# Patient Record
Sex: Female | Born: 1935
Health system: Southern US, Community
[De-identification: ages and names within clinical notes are randomized; demographics above are authoritative.]

## PROBLEM LIST (undated history)

## (undated) DIAGNOSIS — M1990 Unspecified osteoarthritis, unspecified site: Secondary | ICD-10-CM

## (undated) DIAGNOSIS — E785 Hyperlipidemia, unspecified: Secondary | ICD-10-CM

## (undated) DIAGNOSIS — E559 Vitamin D deficiency, unspecified: Secondary | ICD-10-CM

## (undated) DIAGNOSIS — K219 Gastro-esophageal reflux disease without esophagitis: Secondary | ICD-10-CM

## (undated) DIAGNOSIS — M8580 Other specified disorders of bone density and structure, unspecified site: Secondary | ICD-10-CM

## (undated) DIAGNOSIS — G43909 Migraine, unspecified, not intractable, without status migrainosus: Secondary | ICD-10-CM

## (undated) DIAGNOSIS — M810 Age-related osteoporosis without current pathological fracture: Secondary | ICD-10-CM

## (undated) HISTORY — DX: Migraine, unspecified, not intractable, without status migrainosus: G43.909

## (undated) HISTORY — DX: Age-related osteoporosis without current pathological fracture: M81.0

## (undated) HISTORY — DX: Hyperlipidemia, unspecified: E78.5

## (undated) HISTORY — PX: HERNIA REPAIR: SHX51

## (undated) HISTORY — DX: Gastro-esophageal reflux disease without esophagitis: K21.9

## (undated) HISTORY — DX: Other specified disorders of bone density and structure, unspecified site: M85.80

## (undated) HISTORY — DX: Vitamin D deficiency, unspecified: E55.9

---

## 2009-05-16 ENCOUNTER — Emergency Department (HOSPITAL_COMMUNITY)
Admission: EM | Admit: 2009-05-16 | Discharge: 2009-05-16 | Payer: Self-pay | Source: Home / Self Care | Admitting: Family Medicine

## 2009-06-01 DIAGNOSIS — K219 Gastro-esophageal reflux disease without esophagitis: Secondary | ICD-10-CM | POA: Insufficient documentation

## 2009-06-28 ENCOUNTER — Encounter: Admission: RE | Admit: 2009-06-28 | Discharge: 2009-06-28 | Payer: Self-pay | Admitting: Family Medicine

## 2009-07-03 ENCOUNTER — Encounter: Admission: RE | Admit: 2009-07-03 | Discharge: 2009-07-03 | Payer: Self-pay | Admitting: Family Medicine

## 2009-07-15 ENCOUNTER — Encounter: Admission: RE | Admit: 2009-07-15 | Discharge: 2009-07-15 | Payer: Self-pay | Admitting: Specialist

## 2009-10-05 ENCOUNTER — Encounter: Admission: RE | Admit: 2009-10-05 | Discharge: 2009-10-05 | Payer: Self-pay | Admitting: Neurosurgery

## 2009-11-21 ENCOUNTER — Encounter: Admission: RE | Admit: 2009-11-21 | Discharge: 2010-01-22 | Payer: Self-pay | Admitting: Rheumatology

## 2013-02-17 ENCOUNTER — Other Ambulatory Visit: Payer: Self-pay

## 2013-02-17 DIAGNOSIS — Z1231 Encounter for screening mammogram for malignant neoplasm of breast: Secondary | ICD-10-CM

## 2013-03-22 ENCOUNTER — Ambulatory Visit: Payer: Self-pay

## 2013-12-15 ENCOUNTER — Emergency Department (HOSPITAL_BASED_OUTPATIENT_CLINIC_OR_DEPARTMENT_OTHER)
Admission: EM | Admit: 2013-12-15 | Discharge: 2013-12-15 | Discharge status: Against Medical Advice | Payer: Medicare Other | Attending: Emergency Medicine | Admitting: Emergency Medicine

## 2013-12-15 ENCOUNTER — Encounter (HOSPITAL_BASED_OUTPATIENT_CLINIC_OR_DEPARTMENT_OTHER): Payer: Self-pay | Admitting: Emergency Medicine

## 2013-12-15 DIAGNOSIS — R109 Unspecified abdominal pain: Secondary | ICD-10-CM | POA: Insufficient documentation

## 2013-12-15 HISTORY — DX: Unspecified osteoarthritis, unspecified site: M19.90

## 2013-12-15 LAB — URINALYSIS, ROUTINE W REFLEX MICROSCOPIC
BILIRUBIN URINE: NEGATIVE
Glucose, UA: NEGATIVE mg/dL
Hgb urine dipstick: NEGATIVE
KETONES UR: NEGATIVE mg/dL
LEUKOCYTES UA: NEGATIVE
Nitrite: NEGATIVE
PROTEIN: NEGATIVE mg/dL
Specific Gravity, Urine: 1.017 (ref 1.005–1.030)
UROBILINOGEN UA: 0.2 mg/dL (ref 0.0–1.0)
pH: 6 (ref 5.0–8.0)

## 2013-12-15 NOTE — ED Notes (Signed)
Right flank pain x 3 days.

## 2013-12-15 NOTE — ED Notes (Signed)
Pt's family sts that they are taking the pt to [facility].

## 2015-04-30 DIAGNOSIS — J300 Vasomotor rhinitis: Secondary | ICD-10-CM | POA: Diagnosis not present

## 2015-04-30 DIAGNOSIS — Z713 Dietary counseling and surveillance: Secondary | ICD-10-CM | POA: Diagnosis not present

## 2015-04-30 DIAGNOSIS — E663 Overweight: Secondary | ICD-10-CM | POA: Diagnosis not present

## 2015-04-30 DIAGNOSIS — Z6828 Body mass index (BMI) 28.0-28.9, adult: Secondary | ICD-10-CM | POA: Diagnosis not present

## 2015-04-30 DIAGNOSIS — H6123 Impacted cerumen, bilateral: Secondary | ICD-10-CM | POA: Diagnosis not present

## 2015-04-30 DIAGNOSIS — H9313 Tinnitus, bilateral: Secondary | ICD-10-CM | POA: Diagnosis not present

## 2015-04-30 DIAGNOSIS — H6093 Unspecified otitis externa, bilateral: Secondary | ICD-10-CM | POA: Diagnosis not present

## 2015-07-11 DIAGNOSIS — H02054 Trichiasis without entropian left upper eyelid: Secondary | ICD-10-CM | POA: Diagnosis not present

## 2015-07-11 DIAGNOSIS — H02051 Trichiasis without entropian right upper eyelid: Secondary | ICD-10-CM | POA: Diagnosis not present

## 2016-02-25 DIAGNOSIS — H02051 Trichiasis without entropian right upper eyelid: Secondary | ICD-10-CM | POA: Diagnosis not present

## 2016-02-25 DIAGNOSIS — H1013 Acute atopic conjunctivitis, bilateral: Secondary | ICD-10-CM | POA: Diagnosis not present

## 2016-02-25 DIAGNOSIS — H02054 Trichiasis without entropian left upper eyelid: Secondary | ICD-10-CM | POA: Diagnosis not present

## 2016-03-12 DIAGNOSIS — M79641 Pain in right hand: Secondary | ICD-10-CM | POA: Diagnosis not present

## 2016-03-12 DIAGNOSIS — Z79899 Other long term (current) drug therapy: Secondary | ICD-10-CM | POA: Diagnosis not present

## 2016-03-12 DIAGNOSIS — M150 Primary generalized (osteo)arthritis: Secondary | ICD-10-CM | POA: Diagnosis not present

## 2016-03-12 DIAGNOSIS — M0589 Other rheumatoid arthritis with rheumatoid factor of multiple sites: Secondary | ICD-10-CM | POA: Diagnosis not present

## 2016-03-12 DIAGNOSIS — M79642 Pain in left hand: Secondary | ICD-10-CM | POA: Diagnosis not present

## 2016-03-12 DIAGNOSIS — M2550 Pain in unspecified joint: Secondary | ICD-10-CM | POA: Diagnosis not present

## 2016-03-18 DIAGNOSIS — H16143 Punctate keratitis, bilateral: Secondary | ICD-10-CM | POA: Diagnosis not present

## 2016-03-18 DIAGNOSIS — Z961 Presence of intraocular lens: Secondary | ICD-10-CM | POA: Diagnosis not present

## 2016-03-18 DIAGNOSIS — H02051 Trichiasis without entropian right upper eyelid: Secondary | ICD-10-CM | POA: Diagnosis not present

## 2016-03-18 DIAGNOSIS — Z9841 Cataract extraction status, right eye: Secondary | ICD-10-CM | POA: Diagnosis not present

## 2016-03-18 DIAGNOSIS — H02413 Mechanical ptosis of bilateral eyelids: Secondary | ICD-10-CM | POA: Diagnosis not present

## 2016-03-18 DIAGNOSIS — L908 Other atrophic disorders of skin: Secondary | ICD-10-CM | POA: Diagnosis not present

## 2016-03-18 DIAGNOSIS — H02011 Cicatricial entropion of right upper eyelid: Secondary | ICD-10-CM | POA: Diagnosis not present

## 2016-03-18 DIAGNOSIS — H11233 Symblepharon, bilateral: Secondary | ICD-10-CM | POA: Diagnosis not present

## 2016-03-18 DIAGNOSIS — Z9842 Cataract extraction status, left eye: Secondary | ICD-10-CM | POA: Diagnosis not present

## 2016-03-18 DIAGNOSIS — H02054 Trichiasis without entropian left upper eyelid: Secondary | ICD-10-CM | POA: Diagnosis not present

## 2016-03-18 DIAGNOSIS — L121 Cicatricial pemphigoid: Secondary | ICD-10-CM | POA: Diagnosis not present

## 2016-03-18 DIAGNOSIS — H02014 Cicatricial entropion of left upper eyelid: Secondary | ICD-10-CM | POA: Diagnosis not present

## 2016-03-19 DIAGNOSIS — R05 Cough: Secondary | ICD-10-CM | POA: Diagnosis not present

## 2016-03-19 DIAGNOSIS — M069 Rheumatoid arthritis, unspecified: Secondary | ICD-10-CM | POA: Diagnosis not present

## 2016-03-19 DIAGNOSIS — J329 Chronic sinusitis, unspecified: Secondary | ICD-10-CM | POA: Diagnosis not present

## 2016-03-19 DIAGNOSIS — H6980 Other specified disorders of Eustachian tube, unspecified ear: Secondary | ICD-10-CM | POA: Diagnosis not present

## 2016-03-26 DIAGNOSIS — Z23 Encounter for immunization: Secondary | ICD-10-CM | POA: Diagnosis not present

## 2016-03-27 DIAGNOSIS — Z961 Presence of intraocular lens: Secondary | ICD-10-CM | POA: Diagnosis not present

## 2016-03-27 DIAGNOSIS — H02051 Trichiasis without entropian right upper eyelid: Secondary | ICD-10-CM | POA: Diagnosis not present

## 2016-03-27 DIAGNOSIS — H02053 Trichiasis without entropian right eye, unspecified eyelid: Secondary | ICD-10-CM | POA: Diagnosis not present

## 2016-03-27 DIAGNOSIS — H02011 Cicatricial entropion of right upper eyelid: Secondary | ICD-10-CM | POA: Diagnosis not present

## 2016-03-27 DIAGNOSIS — H02054 Trichiasis without entropian left upper eyelid: Secondary | ICD-10-CM | POA: Diagnosis not present

## 2016-03-27 DIAGNOSIS — L121 Cicatricial pemphigoid: Secondary | ICD-10-CM | POA: Diagnosis not present

## 2016-03-27 DIAGNOSIS — H02014 Cicatricial entropion of left upper eyelid: Secondary | ICD-10-CM | POA: Diagnosis not present

## 2016-04-28 DIAGNOSIS — Z79899 Other long term (current) drug therapy: Secondary | ICD-10-CM | POA: Diagnosis not present

## 2016-04-28 DIAGNOSIS — L121 Cicatricial pemphigoid: Secondary | ICD-10-CM | POA: Diagnosis not present

## 2016-04-28 DIAGNOSIS — Z5181 Encounter for therapeutic drug level monitoring: Secondary | ICD-10-CM | POA: Diagnosis not present

## 2016-05-16 DIAGNOSIS — J329 Chronic sinusitis, unspecified: Secondary | ICD-10-CM | POA: Diagnosis not present

## 2016-05-16 DIAGNOSIS — M1990 Unspecified osteoarthritis, unspecified site: Secondary | ICD-10-CM | POA: Diagnosis not present

## 2016-05-16 DIAGNOSIS — K5909 Other constipation: Secondary | ICD-10-CM | POA: Diagnosis not present

## 2016-05-26 DIAGNOSIS — Z9841 Cataract extraction status, right eye: Secondary | ICD-10-CM | POA: Diagnosis not present

## 2016-05-26 DIAGNOSIS — H02051 Trichiasis without entropian right upper eyelid: Secondary | ICD-10-CM | POA: Diagnosis not present

## 2016-05-26 DIAGNOSIS — L121 Cicatricial pemphigoid: Secondary | ICD-10-CM | POA: Diagnosis not present

## 2016-05-26 DIAGNOSIS — H11243 Scarring of conjunctiva, bilateral: Secondary | ICD-10-CM | POA: Diagnosis not present

## 2016-05-26 DIAGNOSIS — H2189 Other specified disorders of iris and ciliary body: Secondary | ICD-10-CM | POA: Diagnosis not present

## 2016-05-26 DIAGNOSIS — H02403 Unspecified ptosis of bilateral eyelids: Secondary | ICD-10-CM | POA: Diagnosis not present

## 2016-05-26 DIAGNOSIS — H0289 Other specified disorders of eyelid: Secondary | ICD-10-CM | POA: Diagnosis not present

## 2016-05-26 DIAGNOSIS — H02011 Cicatricial entropion of right upper eyelid: Secondary | ICD-10-CM | POA: Diagnosis not present

## 2016-05-26 DIAGNOSIS — Z9889 Other specified postprocedural states: Secondary | ICD-10-CM | POA: Diagnosis not present

## 2016-05-26 DIAGNOSIS — H02056 Trichiasis without entropian left eye, unspecified eyelid: Secondary | ICD-10-CM | POA: Diagnosis not present

## 2016-05-26 DIAGNOSIS — H16143 Punctate keratitis, bilateral: Secondary | ICD-10-CM | POA: Diagnosis not present

## 2016-05-26 DIAGNOSIS — Z9842 Cataract extraction status, left eye: Secondary | ICD-10-CM | POA: Diagnosis not present

## 2016-05-26 DIAGNOSIS — H02014 Cicatricial entropion of left upper eyelid: Secondary | ICD-10-CM | POA: Diagnosis not present

## 2016-05-26 DIAGNOSIS — Z961 Presence of intraocular lens: Secondary | ICD-10-CM | POA: Diagnosis not present

## 2016-05-26 DIAGNOSIS — H02053 Trichiasis without entropian right eye, unspecified eyelid: Secondary | ICD-10-CM | POA: Diagnosis not present

## 2016-05-26 DIAGNOSIS — H02054 Trichiasis without entropian left upper eyelid: Secondary | ICD-10-CM | POA: Diagnosis not present

## 2016-06-02 DIAGNOSIS — Z79899 Other long term (current) drug therapy: Secondary | ICD-10-CM | POA: Diagnosis not present

## 2016-06-02 DIAGNOSIS — L121 Cicatricial pemphigoid: Secondary | ICD-10-CM | POA: Diagnosis not present

## 2016-06-05 DIAGNOSIS — M0589 Other rheumatoid arthritis with rheumatoid factor of multiple sites: Secondary | ICD-10-CM | POA: Diagnosis not present

## 2016-06-05 DIAGNOSIS — M1712 Unilateral primary osteoarthritis, left knee: Secondary | ICD-10-CM | POA: Diagnosis not present

## 2016-06-05 DIAGNOSIS — M25511 Pain in right shoulder: Secondary | ICD-10-CM | POA: Diagnosis not present

## 2016-06-05 DIAGNOSIS — Z79899 Other long term (current) drug therapy: Secondary | ICD-10-CM | POA: Diagnosis not present

## 2016-06-05 DIAGNOSIS — M150 Primary generalized (osteo)arthritis: Secondary | ICD-10-CM | POA: Diagnosis not present

## 2016-06-05 DIAGNOSIS — M25569 Pain in unspecified knee: Secondary | ICD-10-CM | POA: Diagnosis not present

## 2016-06-05 DIAGNOSIS — M810 Age-related osteoporosis without current pathological fracture: Secondary | ICD-10-CM | POA: Diagnosis not present

## 2016-06-05 DIAGNOSIS — M1711 Unilateral primary osteoarthritis, right knee: Secondary | ICD-10-CM | POA: Diagnosis not present

## 2016-06-13 DIAGNOSIS — M150 Primary generalized (osteo)arthritis: Secondary | ICD-10-CM | POA: Diagnosis not present

## 2016-06-13 DIAGNOSIS — M0589 Other rheumatoid arthritis with rheumatoid factor of multiple sites: Secondary | ICD-10-CM | POA: Diagnosis not present

## 2016-06-13 DIAGNOSIS — Z79899 Other long term (current) drug therapy: Secondary | ICD-10-CM | POA: Diagnosis not present

## 2016-06-13 DIAGNOSIS — M810 Age-related osteoporosis without current pathological fracture: Secondary | ICD-10-CM | POA: Diagnosis not present

## 2016-06-17 DIAGNOSIS — H04553 Acquired stenosis of bilateral nasolacrimal duct: Secondary | ICD-10-CM | POA: Diagnosis not present

## 2016-06-17 DIAGNOSIS — Z9889 Other specified postprocedural states: Secondary | ICD-10-CM | POA: Diagnosis not present

## 2016-06-17 DIAGNOSIS — Z48810 Encounter for surgical aftercare following surgery on the sense organs: Secondary | ICD-10-CM | POA: Diagnosis not present

## 2016-06-17 DIAGNOSIS — Z79899 Other long term (current) drug therapy: Secondary | ICD-10-CM | POA: Diagnosis not present

## 2016-06-17 DIAGNOSIS — L121 Cicatricial pemphigoid: Secondary | ICD-10-CM | POA: Diagnosis not present

## 2016-07-10 DIAGNOSIS — M810 Age-related osteoporosis without current pathological fracture: Secondary | ICD-10-CM | POA: Diagnosis not present

## 2016-07-10 DIAGNOSIS — M150 Primary generalized (osteo)arthritis: Secondary | ICD-10-CM | POA: Diagnosis not present

## 2016-07-10 DIAGNOSIS — M0589 Other rheumatoid arthritis with rheumatoid factor of multiple sites: Secondary | ICD-10-CM | POA: Diagnosis not present

## 2016-07-10 DIAGNOSIS — M25511 Pain in right shoulder: Secondary | ICD-10-CM | POA: Diagnosis not present

## 2016-07-10 DIAGNOSIS — Z79899 Other long term (current) drug therapy: Secondary | ICD-10-CM | POA: Diagnosis not present

## 2016-07-11 ENCOUNTER — Other Ambulatory Visit: Payer: Self-pay | Admitting: Rheumatology

## 2016-07-11 DIAGNOSIS — M25511 Pain in right shoulder: Secondary | ICD-10-CM

## 2016-07-17 DIAGNOSIS — M79675 Pain in left toe(s): Secondary | ICD-10-CM | POA: Diagnosis not present

## 2016-07-22 DIAGNOSIS — D2372 Other benign neoplasm of skin of left lower limb, including hip: Secondary | ICD-10-CM | POA: Diagnosis not present

## 2016-07-22 DIAGNOSIS — M25572 Pain in left ankle and joints of left foot: Secondary | ICD-10-CM | POA: Diagnosis not present

## 2016-07-27 ENCOUNTER — Ambulatory Visit
Admission: RE | Admit: 2016-07-27 | Discharge: 2016-07-27 | Disposition: A | Discharge status: Home / Self Care | Payer: Medicare Other | Source: Ambulatory Visit | Attending: Rheumatology | Admitting: Rheumatology

## 2016-07-27 DIAGNOSIS — M25511 Pain in right shoulder: Secondary | ICD-10-CM | POA: Diagnosis not present

## 2016-11-04 DIAGNOSIS — H6980 Other specified disorders of Eustachian tube, unspecified ear: Secondary | ICD-10-CM | POA: Diagnosis not present

## 2016-11-12 DIAGNOSIS — H9311 Tinnitus, right ear: Secondary | ICD-10-CM | POA: Diagnosis not present

## 2016-11-24 DIAGNOSIS — L121 Cicatricial pemphigoid: Secondary | ICD-10-CM | POA: Diagnosis not present

## 2016-11-24 DIAGNOSIS — H02054 Trichiasis without entropian left upper eyelid: Secondary | ICD-10-CM | POA: Diagnosis not present

## 2016-11-24 DIAGNOSIS — H02051 Trichiasis without entropian right upper eyelid: Secondary | ICD-10-CM | POA: Diagnosis not present

## 2016-11-28 DIAGNOSIS — M67911 Unspecified disorder of synovium and tendon, right shoulder: Secondary | ICD-10-CM | POA: Diagnosis not present

## 2016-12-02 DIAGNOSIS — K5900 Constipation, unspecified: Secondary | ICD-10-CM | POA: Diagnosis not present

## 2016-12-04 DIAGNOSIS — H903 Sensorineural hearing loss, bilateral: Secondary | ICD-10-CM | POA: Diagnosis not present

## 2016-12-04 DIAGNOSIS — H9313 Tinnitus, bilateral: Secondary | ICD-10-CM | POA: Diagnosis not present

## 2016-12-04 DIAGNOSIS — H6983 Other specified disorders of Eustachian tube, bilateral: Secondary | ICD-10-CM | POA: Diagnosis not present

## 2016-12-05 DIAGNOSIS — M19211 Secondary osteoarthritis, right shoulder: Secondary | ICD-10-CM | POA: Diagnosis not present

## 2016-12-11 ENCOUNTER — Other Ambulatory Visit: Payer: Self-pay | Admitting: Orthopedic Surgery

## 2016-12-16 ENCOUNTER — Other Ambulatory Visit: Payer: Self-pay | Admitting: Obstetrics & Gynecology

## 2016-12-16 ENCOUNTER — Other Ambulatory Visit: Payer: Self-pay | Admitting: Orthopedic Surgery

## 2016-12-23 DIAGNOSIS — H02051 Trichiasis without entropian right upper eyelid: Secondary | ICD-10-CM | POA: Diagnosis not present

## 2016-12-23 DIAGNOSIS — H02054 Trichiasis without entropian left upper eyelid: Secondary | ICD-10-CM | POA: Diagnosis not present

## 2016-12-23 DIAGNOSIS — Z7189 Other specified counseling: Secondary | ICD-10-CM | POA: Diagnosis not present

## 2016-12-23 DIAGNOSIS — Z9842 Cataract extraction status, left eye: Secondary | ICD-10-CM | POA: Diagnosis not present

## 2016-12-23 DIAGNOSIS — H04129 Dry eye syndrome of unspecified lacrimal gland: Secondary | ICD-10-CM | POA: Diagnosis not present

## 2016-12-23 DIAGNOSIS — H179 Unspecified corneal scar and opacity: Secondary | ICD-10-CM | POA: Diagnosis not present

## 2016-12-23 DIAGNOSIS — Z961 Presence of intraocular lens: Secondary | ICD-10-CM | POA: Diagnosis not present

## 2016-12-23 DIAGNOSIS — H02056 Trichiasis without entropian left eye, unspecified eyelid: Secondary | ICD-10-CM | POA: Diagnosis not present

## 2016-12-23 DIAGNOSIS — H02413 Mechanical ptosis of bilateral eyelids: Secondary | ICD-10-CM | POA: Diagnosis not present

## 2016-12-23 DIAGNOSIS — Z9889 Other specified postprocedural states: Secondary | ICD-10-CM | POA: Diagnosis not present

## 2016-12-23 DIAGNOSIS — H11233 Symblepharon, bilateral: Secondary | ICD-10-CM | POA: Diagnosis not present

## 2016-12-23 DIAGNOSIS — H0289 Other specified disorders of eyelid: Secondary | ICD-10-CM | POA: Diagnosis not present

## 2016-12-23 DIAGNOSIS — E785 Hyperlipidemia, unspecified: Secondary | ICD-10-CM | POA: Diagnosis not present

## 2016-12-23 DIAGNOSIS — H02014 Cicatricial entropion of left upper eyelid: Secondary | ICD-10-CM | POA: Diagnosis not present

## 2016-12-23 DIAGNOSIS — Z9841 Cataract extraction status, right eye: Secondary | ICD-10-CM | POA: Diagnosis not present

## 2016-12-23 DIAGNOSIS — H02011 Cicatricial entropion of right upper eyelid: Secondary | ICD-10-CM | POA: Diagnosis not present

## 2016-12-23 DIAGNOSIS — H16143 Punctate keratitis, bilateral: Secondary | ICD-10-CM | POA: Diagnosis not present

## 2016-12-23 DIAGNOSIS — H04553 Acquired stenosis of bilateral nasolacrimal duct: Secondary | ICD-10-CM | POA: Diagnosis not present

## 2016-12-23 DIAGNOSIS — H02053 Trichiasis without entropian right eye, unspecified eyelid: Secondary | ICD-10-CM | POA: Diagnosis not present

## 2016-12-23 DIAGNOSIS — L121 Cicatricial pemphigoid: Secondary | ICD-10-CM | POA: Diagnosis not present

## 2016-12-23 DIAGNOSIS — H04203 Unspecified epiphora, bilateral lacrimal glands: Secondary | ICD-10-CM | POA: Diagnosis not present

## 2016-12-23 DIAGNOSIS — H02523 Blepharophimosis right eye, unspecified eyelid: Secondary | ICD-10-CM | POA: Diagnosis not present

## 2016-12-23 DIAGNOSIS — H02526 Blepharophimosis left eye, unspecified eyelid: Secondary | ICD-10-CM | POA: Diagnosis not present

## 2016-12-30 ENCOUNTER — Encounter (HOSPITAL_COMMUNITY): Payer: Self-pay

## 2016-12-30 ENCOUNTER — Ambulatory Visit (HOSPITAL_COMMUNITY)
Admission: RE | Admit: 2016-12-30 | Discharge: 2016-12-30 | Disposition: A | Discharge status: Home / Self Care | Payer: Medicare Other | Source: Ambulatory Visit | Attending: Orthopedic Surgery | Admitting: Orthopedic Surgery

## 2016-12-30 ENCOUNTER — Encounter (HOSPITAL_COMMUNITY)
Admission: RE | Admit: 2016-12-30 | Discharge: 2016-12-30 | Disposition: A | Discharge status: Home / Self Care | Payer: Medicare Other | Source: Ambulatory Visit | Attending: Orthopedic Surgery | Admitting: Orthopedic Surgery

## 2016-12-30 DIAGNOSIS — Z01818 Encounter for other preprocedural examination: Secondary | ICD-10-CM | POA: Insufficient documentation

## 2016-12-30 DIAGNOSIS — Z96611 Presence of right artificial shoulder joint: Secondary | ICD-10-CM | POA: Diagnosis not present

## 2016-12-30 DIAGNOSIS — R918 Other nonspecific abnormal finding of lung field: Secondary | ICD-10-CM | POA: Insufficient documentation

## 2016-12-30 DIAGNOSIS — I700 Atherosclerosis of aorta: Secondary | ICD-10-CM | POA: Insufficient documentation

## 2016-12-30 DIAGNOSIS — M810 Age-related osteoporosis without current pathological fracture: Secondary | ICD-10-CM | POA: Diagnosis not present

## 2016-12-30 DIAGNOSIS — M47814 Spondylosis without myelopathy or radiculopathy, thoracic region: Secondary | ICD-10-CM

## 2016-12-30 DIAGNOSIS — M19011 Primary osteoarthritis, right shoulder: Secondary | ICD-10-CM | POA: Diagnosis not present

## 2016-12-30 DIAGNOSIS — S42141A Displaced fracture of glenoid cavity of scapula, right shoulder, initial encounter for closed fracture: Secondary | ICD-10-CM | POA: Diagnosis not present

## 2016-12-30 DIAGNOSIS — M9689 Other intraoperative and postprocedural complications and disorders of the musculoskeletal system: Secondary | ICD-10-CM | POA: Diagnosis not present

## 2016-12-30 DIAGNOSIS — Z79899 Other long term (current) drug therapy: Secondary | ICD-10-CM | POA: Diagnosis not present

## 2016-12-30 DIAGNOSIS — Z5321 Procedure and treatment not carried out due to patient leaving prior to being seen by health care provider: Secondary | ICD-10-CM | POA: Diagnosis not present

## 2016-12-30 DIAGNOSIS — Z91018 Allergy to other foods: Secondary | ICD-10-CM | POA: Diagnosis not present

## 2016-12-30 DIAGNOSIS — M75101 Unspecified rotator cuff tear or rupture of right shoulder, not specified as traumatic: Secondary | ICD-10-CM | POA: Diagnosis not present

## 2016-12-30 DIAGNOSIS — M9669 Fracture of other bone following insertion of orthopedic implant, joint prosthesis, or bone plate: Secondary | ICD-10-CM | POA: Diagnosis not present

## 2016-12-30 LAB — COMPREHENSIVE METABOLIC PANEL WITH GFR
ALT: 12 U/L — ABNORMAL LOW (ref 14–54)
AST: 17 U/L (ref 15–41)
Albumin: 4.2 g/dL (ref 3.5–5.0)
Alkaline Phosphatase: 53 U/L (ref 38–126)
Anion gap: 5 (ref 5–15)
BUN: 9 mg/dL (ref 6–20)
CO2: 27 mmol/L (ref 22–32)
Calcium: 9.5 mg/dL (ref 8.9–10.3)
Chloride: 105 mmol/L (ref 101–111)
Creatinine, Ser: 0.53 mg/dL (ref 0.44–1.00)
GFR calc Af Amer: >60 mL/min
GFR calc non Af Amer: >60 mL/min
Glucose, Bld: 94 mg/dL (ref 65–99)
Potassium: 4.5 mmol/L (ref 3.5–5.1)
Sodium: 137 mmol/L (ref 135–145)
Total Bilirubin: 0.7 mg/dL (ref 0.3–1.2)
Total Protein: 7.1 g/dL (ref 6.5–8.1)

## 2016-12-30 LAB — SURGICAL PCR SCREEN
MRSA, PCR: NEGATIVE
Staphylococcus aureus: POSITIVE — AB

## 2016-12-30 LAB — URINALYSIS, ROUTINE W REFLEX MICROSCOPIC
BILIRUBIN URINE: NEGATIVE
GLUCOSE, UA: NEGATIVE mg/dL
HGB URINE DIPSTICK: NEGATIVE
Ketones, ur: NEGATIVE mg/dL
Leukocytes, UA: NEGATIVE
Nitrite: NEGATIVE
PH: 7 (ref 5.0–8.0)
Protein, ur: NEGATIVE mg/dL
SPECIFIC GRAVITY, URINE: 1.006 (ref 1.005–1.030)

## 2016-12-30 LAB — CBC WITH DIFFERENTIAL/PLATELET
BASOS PCT: 0 %
Basophils Absolute: 0 10*3/uL (ref 0.0–0.1)
Eosinophils Absolute: 0.1 10*3/uL (ref 0.0–0.7)
Eosinophils Relative: 2 %
HEMATOCRIT: 39.6 % (ref 36.0–46.0)
HEMOGLOBIN: 12.9 g/dL (ref 12.0–15.0)
LYMPHS ABS: 2.5 10*3/uL (ref 0.7–4.0)
Lymphocytes Relative: 39 %
MCH: 29.5 pg (ref 26.0–34.0)
MCHC: 32.6 g/dL (ref 30.0–36.0)
MCV: 90.6 fL (ref 78.0–100.0)
MONOS PCT: 8 %
Monocytes Absolute: 0.5 10*3/uL (ref 0.1–1.0)
NEUTROS ABS: 3.2 10*3/uL (ref 1.7–7.7)
NEUTROS PCT: 51 %
Platelets: 270 10*3/uL (ref 150–400)
RBC: 4.37 MIL/uL (ref 3.87–5.11)
RDW: 13.4 % (ref 11.5–15.5)
WBC: 6.4 10*3/uL (ref 4.0–10.5)

## 2016-12-30 LAB — PROTIME-INR
INR: 1.05
Prothrombin Time: 13.6 s (ref 11.4–15.2)

## 2016-12-30 LAB — APTT: aPTT: 32 s (ref 24–36)

## 2016-12-30 NOTE — Progress Notes (Addendum)
PCP - [facility] at [facility]  Chest x-ray - 12/30/2016  EKG - 12/30/2016   Pt denies cardiac history or cardiac workup.   Patient denies shortness of breath, fever, cough and chest pain at PAT appointment  Patient verbalized understanding of instructions that were given to them at the PAT appointment. Patient was also instructed that they will need to review over the PAT instructions again at home before surgery.  Pt speaks Arabic. Pt son to interpret at PAT appointment and pt daughter/son to be present DOS to interpret. Interpreter also requested for DOS.

## 2016-12-30 NOTE — Pre-Procedure Instructions (Signed)
    Gelena Klosinski  12/30/2016     No Pharmacies Listed   Your procedure is scheduled on Thursday September 27.  Report to [facility] Admitting at 8:30 A.M.  Call this number if you have problems the morning of surgery:  [phone]   Remember:  Do not eat food or drink liquids after midnight.  Take these medicines the morning of surgery with A SIP OF WATER: Tramadol if needed  7 days prior to surgery STOP taking any Aspirin, Aleve, Naproxen, Ibuprofen, Motrin, Advil, Goody's, BC's, all herbal medications, fish oil, and all vitamins    Do not wear jewelry, make-up or nail polish.  Do not wear lotions, powders, or perfumes, or deoderant.  Do not shave 48 hours prior to surgery.  Men may shave face and neck.  Do not bring valuables to the hospital.  [facility] is not responsible for any belongings or valuables.  Contacts, dentures or bridgework may not be worn into surgery.  Leave your suitcase in the car.  After surgery it may be brought to your room.  For patients admitted to the hospital, discharge time will be determined by your treatment team.  Patients discharged the day of surgery will not be allowed to drive home.   Special instructions:    [place]- Preparing For Surgery  Before surgery, you can play an important role. Because skin is not sterile, your skin needs to be as free of germs as possible. You can reduce the number of germs on your skin by washing with CHG (chlorahexidine gluconate) Soap before surgery.  CHG is an antiseptic cleaner which kills germs and bonds with the skin to continue killing germs even after washing.  Please do not use if you have an allergy to CHG or antibacterial soaps. If your skin becomes reddened/irritated stop using the CHG.  Do not shave (including legs and underarms) for at least 48 hours prior to first CHG shower. It is OK to shave your face.  Please follow these instructions carefully.   1. Shower the NIGHT BEFORE  SURGERY and the MORNING OF SURGERY with CHG.   2. If you chose to wash your hair, wash your hair first as usual with your normal shampoo.  3. After you shampoo, rinse your hair and body thoroughly to remove the shampoo.  4. Use CHG as you would any other liquid soap. You can apply CHG directly to the skin and wash gently with a scrungie or a clean washcloth.   5. Apply the CHG Soap to your body ONLY FROM THE NECK DOWN.  Do not use on open wounds or open sores. Avoid contact with your eyes, ears, mouth and genitals (private parts). Wash genitals (private parts) with your normal soap.  6. Wash thoroughly, paying special attention to the area where your surgery will be performed.  7. Thoroughly rinse your body with warm water from the neck down.  8. DO NOT shower/wash with your normal soap after using and rinsing off the CHG Soap.  9. Pat yourself dry with a CLEAN TOWEL.   10. Wear CLEAN PAJAMAS   11. Place CLEAN SHEETS on your bed the night of your first shower and DO NOT SLEEP WITH PETS.    Day of Surgery: Do not apply any deodorants/lotions. Please wear clean clothes to the hospital/surgery center.      Please read over the following fact sheets that you were given. MRSA Information

## 2016-12-31 NOTE — Anesthesia Preprocedure Evaluation (Addendum)
Anesthesia Evaluation  Patient identified by MRN, date of birth, ID band Patient awake    Reviewed: Allergy & Precautions, NPO status , Patient's Chart, lab work & pertinent test results  Airway Mallampati: II  TM Distance: >3 FB Neck ROM: Full    Dental  (+) Dental Advisory Given   Pulmonary neg pulmonary ROS,    Pulmonary exam normal breath sounds clear to auscultation       Cardiovascular Exercise Tolerance: Good negative cardio ROS Normal cardiovascular exam Rhythm:Regular Rate:Normal     Neuro/Psych  Headaches, negative psych ROS   GI/Hepatic Neg liver ROS, GERD  Controlled,  Endo/Other  negative endocrine ROS  Renal/GU negative Renal ROS  negative genitourinary   Musculoskeletal  (+) Arthritis , Osteoarthritis,    Abdominal   Peds  Hematology negative hematology ROS (+)   Anesthesia Other Findings   Reproductive/Obstetrics                            Anesthesia Physical Anesthesia Plan  ASA: II  Anesthesia Plan: General   Post-op Pain Management:  Regional for Post-op pain   Induction: Intravenous  PONV Risk Score and Plan: 4 or greater and Ondansetron and Treatment may vary due to age or medical condition  Airway Management Planned: Oral ETT  Additional Equipment: None  Intra-op Plan:   Post-operative Plan: Extubation in OR  Informed Consent: I have reviewed the patients History and Physical, chart, labs and discussed the procedure including the risks, benefits and alternatives for the proposed anesthesia with the patient or authorized representative who has indicated his/her understanding and acceptance.   Dental advisory given  Plan Discussed with: CRNA  Anesthesia Plan Comments:         Anesthesia Quick Evaluation

## 2017-01-01 ENCOUNTER — Inpatient Hospital Stay (HOSPITAL_COMMUNITY): Payer: Medicare Other | Admitting: Anesthesiology

## 2017-01-01 ENCOUNTER — Inpatient Hospital Stay (HOSPITAL_COMMUNITY)
Admission: RE | Admit: 2017-01-01 | Discharge: 2017-01-02 | DRG: 483 | Discharge status: Against Medical Advice | Payer: Medicare Other | Source: Ambulatory Visit | Attending: Orthopedic Surgery | Admitting: Orthopedic Surgery

## 2017-01-01 ENCOUNTER — Inpatient Hospital Stay (HOSPITAL_COMMUNITY): Payer: Medicare Other

## 2017-01-01 ENCOUNTER — Inpatient Hospital Stay (HOSPITAL_COMMUNITY): Payer: Medicare Other | Admitting: Emergency Medicine

## 2017-01-01 ENCOUNTER — Encounter (HOSPITAL_COMMUNITY)
Admission: RE | Discharge status: Against Medical Advice | Payer: Self-pay | Source: Ambulatory Visit | Attending: Orthopedic Surgery

## 2017-01-01 ENCOUNTER — Encounter (HOSPITAL_COMMUNITY): Payer: Self-pay | Admitting: *Deleted

## 2017-01-01 DIAGNOSIS — Z96611 Presence of right artificial shoulder joint: Secondary | ICD-10-CM | POA: Diagnosis not present

## 2017-01-01 DIAGNOSIS — S42141A Displaced fracture of glenoid cavity of scapula, right shoulder, initial encounter for closed fracture: Secondary | ICD-10-CM | POA: Diagnosis not present

## 2017-01-01 DIAGNOSIS — M9689 Other intraoperative and postprocedural complications and disorders of the musculoskeletal system: Secondary | ICD-10-CM | POA: Diagnosis not present

## 2017-01-01 DIAGNOSIS — M810 Age-related osteoporosis without current pathological fracture: Secondary | ICD-10-CM | POA: Diagnosis present

## 2017-01-01 DIAGNOSIS — M19011 Primary osteoarthritis, right shoulder: Principal | ICD-10-CM | POA: Diagnosis present

## 2017-01-01 DIAGNOSIS — Z471 Aftercare following joint replacement surgery: Secondary | ICD-10-CM | POA: Diagnosis not present

## 2017-01-01 DIAGNOSIS — M9669 Fracture of other bone following insertion of orthopedic implant, joint prosthesis, or bone plate: Secondary | ICD-10-CM | POA: Diagnosis not present

## 2017-01-01 DIAGNOSIS — Z79899 Other long term (current) drug therapy: Secondary | ICD-10-CM | POA: Diagnosis not present

## 2017-01-01 DIAGNOSIS — Y92234 Operating room of hospital as the place of occurrence of the external cause: Secondary | ICD-10-CM | POA: Diagnosis not present

## 2017-01-01 DIAGNOSIS — G8918 Other acute postprocedural pain: Secondary | ICD-10-CM | POA: Diagnosis not present

## 2017-01-01 DIAGNOSIS — Y834 Other reconstructive surgery as the cause of abnormal reaction of the patient, or of later complication, without mention of misadventure at the time of the procedure: Secondary | ICD-10-CM | POA: Diagnosis not present

## 2017-01-01 DIAGNOSIS — M75101 Unspecified rotator cuff tear or rupture of right shoulder, not specified as traumatic: Secondary | ICD-10-CM | POA: Diagnosis present

## 2017-01-01 DIAGNOSIS — Z5321 Procedure and treatment not carried out due to patient leaving prior to being seen by health care provider: Secondary | ICD-10-CM | POA: Diagnosis not present

## 2017-01-01 DIAGNOSIS — Z91018 Allergy to other foods: Secondary | ICD-10-CM | POA: Diagnosis not present

## 2017-01-01 HISTORY — PX: REVERSE SHOULDER ARTHROPLASTY: SHX5054

## 2017-01-01 LAB — GLUCOSE, CAPILLARY
GLUCOSE-CAPILLARY: 171 mg/dL — AB (ref 65–99)
Glucose-Capillary: 135 mg/dL — ABNORMAL HIGH (ref 65–99)

## 2017-01-01 SURGERY — ARTHROPLASTY, SHOULDER, TOTAL, REVERSE
Anesthesia: General | Site: Shoulder | Laterality: Right

## 2017-01-01 MED ORDER — ACETAMINOPHEN 650 MG RE SUPP: 650.0000 mg | Freq: Four times a day (QID) | RECTAL | Status: DC | PRN

## 2017-01-01 MED ORDER — ONDANSETRON HCL 4 MG/2ML IJ SOLN: 4.0000 mg | Freq: Four times a day (QID) | INTRAMUSCULAR | Status: DC | PRN

## 2017-01-01 MED ORDER — ONDANSETRON HCL 4 MG/2ML IJ SOLN
INTRAMUSCULAR | Status: DC | PRN
  Administered 2017-01-01: 4 mg via INTRAVENOUS

## 2017-01-01 MED ORDER — POLYETHYLENE GLYCOL 3350 17 G PO PACK: 17.0000 g | PACK | Freq: Every day | ORAL | Status: DC | PRN

## 2017-01-01 MED ORDER — METOCLOPRAMIDE HCL 5 MG/ML IJ SOLN: 5.0000 mg | Freq: Three times a day (TID) | INTRAMUSCULAR | Status: DC | PRN

## 2017-01-01 MED ORDER — PROPOFOL 10 MG/ML IV BOLUS
INTRAVENOUS | Status: AC
  Filled 2017-01-01: qty 20

## 2017-01-01 MED ORDER — ALUM & MAG HYDROXIDE-SIMETH 200-200-20 MG/5ML PO SUSP: 30.0000 mL | ORAL | Status: DC | PRN

## 2017-01-01 MED ORDER — DOCUSATE SODIUM 100 MG PO CAPS
100.0000 mg | ORAL_CAPSULE | Freq: Two times a day (BID) | ORAL | Status: DC
  Administered 2017-01-01 – 2017-01-02 (×2): 100 mg via ORAL
  Filled 2017-01-01 (×2): qty 1

## 2017-01-01 MED ORDER — BUPIVACAINE LIPOSOME 1.3 % IJ SUSP
INTRAMUSCULAR | Status: DC | PRN
  Administered 2017-01-01: 20 mL

## 2017-01-01 MED ORDER — BUPIVACAINE-EPINEPHRINE (PF) 0.5% -1:200000 IJ SOLN
INTRAMUSCULAR | Status: DC | PRN
  Administered 2017-01-01: 25 mL via PERINEURAL

## 2017-01-01 MED ORDER — LIDOCAINE 2% (20 MG/ML) 5 ML SYRINGE
INTRAMUSCULAR | Status: AC
  Filled 2017-01-01: qty 5

## 2017-01-01 MED ORDER — MENTHOL 3 MG MT LOZG: 1.0000 | LOZENGE | OROMUCOSAL | Status: DC | PRN

## 2017-01-01 MED ORDER — DEXAMETHASONE SODIUM PHOSPHATE 10 MG/ML IJ SOLN
INTRAMUSCULAR | Status: DC | PRN
  Administered 2017-01-01: 5 mg via INTRAVENOUS

## 2017-01-01 MED ORDER — DEXAMETHASONE SODIUM PHOSPHATE 10 MG/ML IJ SOLN
INTRAMUSCULAR | Status: AC
  Filled 2017-01-01: qty 1

## 2017-01-01 MED ORDER — PROPOFOL 10 MG/ML IV BOLUS
INTRAVENOUS | Status: DC | PRN
  Administered 2017-01-01: 120 mg via INTRAVENOUS

## 2017-01-01 MED ORDER — ASPIRIN EC 325 MG PO TBEC
325.0000 mg | DELAYED_RELEASE_TABLET | Freq: Once | ORAL | Status: AC
  Administered 2017-01-01: 325 mg via ORAL
  Filled 2017-01-01: qty 1

## 2017-01-01 MED ORDER — FENTANYL CITRATE (PF) 100 MCG/2ML IJ SOLN
25.0000 ug | INTRAMUSCULAR | Status: DC | PRN
  Administered 2017-01-01 (×2): 50 ug via INTRAVENOUS

## 2017-01-01 MED ORDER — CEFAZOLIN SODIUM-DEXTROSE 2-4 GM/100ML-% IV SOLN
2.0000 g | INTRAVENOUS | Status: AC
  Administered 2017-01-01: 2 g via INTRAVENOUS

## 2017-01-01 MED ORDER — BUPIVACAINE LIPOSOME 1.3 % IJ SUSP
20.0000 mL | Freq: Once | INTRAMUSCULAR | Status: DC
  Filled 2017-01-01: qty 20

## 2017-01-01 MED ORDER — TRANEXAMIC ACID 1000 MG/10ML IV SOLN
1000.0000 mg | INTRAVENOUS | Status: AC
  Administered 2017-01-01: 1000 mg via INTRAVENOUS
  Filled 2017-01-01: qty 10

## 2017-01-01 MED ORDER — CEFAZOLIN SODIUM-DEXTROSE 2-4 GM/100ML-% IV SOLN
INTRAVENOUS | Status: AC
  Filled 2017-01-01: qty 100

## 2017-01-01 MED ORDER — DIPHENHYDRAMINE HCL 12.5 MG/5ML PO ELIX: 12.5000 mg | ORAL_SOLUTION | ORAL | Status: DC | PRN

## 2017-01-01 MED ORDER — ACETAMINOPHEN 500 MG PO TABS
1000.0000 mg | ORAL_TABLET | Freq: Four times a day (QID) | ORAL | Status: AC
  Administered 2017-01-01 – 2017-01-02 (×3): 1000 mg via ORAL
  Filled 2017-01-01 (×3): qty 2

## 2017-01-01 MED ORDER — PHENOL 1.4 % MT LIQD: 1.0000 | OROMUCOSAL | Status: DC | PRN

## 2017-01-01 MED ORDER — LIDOCAINE 2% (20 MG/ML) 5 ML SYRINGE
INTRAMUSCULAR | Status: DC | PRN
  Administered 2017-01-01: 40 mg via INTRAVENOUS

## 2017-01-01 MED ORDER — FLEET ENEMA 7-19 GM/118ML RE ENEM: 1.0000 | ENEMA | Freq: Once | RECTAL | Status: DC | PRN

## 2017-01-01 MED ORDER — FENTANYL CITRATE (PF) 250 MCG/5ML IJ SOLN
INTRAMUSCULAR | Status: AC
  Filled 2017-01-01: qty 5

## 2017-01-01 MED ORDER — CEFAZOLIN SODIUM-DEXTROSE 1-4 GM/50ML-% IV SOLN
1.0000 g | Freq: Four times a day (QID) | INTRAVENOUS | Status: AC
  Administered 2017-01-01 – 2017-01-02 (×3): 1 g via INTRAVENOUS
  Filled 2017-01-01 (×3): qty 50

## 2017-01-01 MED ORDER — ONDANSETRON HCL 4 MG PO TABS: 4.0000 mg | ORAL_TABLET | Freq: Four times a day (QID) | ORAL | Status: DC | PRN

## 2017-01-01 MED ORDER — ONDANSETRON HCL 4 MG/2ML IJ SOLN: 4.0000 mg | Freq: Once | INTRAMUSCULAR | Status: DC | PRN

## 2017-01-01 MED ORDER — GLYCOPYRROLATE 0.2 MG/ML IJ SOLN
INTRAMUSCULAR | Status: DC | PRN
  Administered 2017-01-01 (×2): 0.2 mg via INTRAVENOUS

## 2017-01-01 MED ORDER — INSULIN ASPART 100 UNIT/ML ~~LOC~~ SOLN: 0.0000 [IU] | Freq: Three times a day (TID) | SUBCUTANEOUS | Status: DC

## 2017-01-01 MED ORDER — ROCURONIUM BROMIDE 10 MG/ML (PF) SYRINGE
PREFILLED_SYRINGE | INTRAVENOUS | Status: AC
  Filled 2017-01-01: qty 5

## 2017-01-01 MED ORDER — ACETAMINOPHEN 325 MG PO TABS: 650.0000 mg | ORAL_TABLET | Freq: Four times a day (QID) | ORAL | Status: DC | PRN

## 2017-01-01 MED ORDER — FENTANYL CITRATE (PF) 100 MCG/2ML IJ SOLN
INTRAMUSCULAR | Status: AC
  Administered 2017-01-01: 50 ug via INTRAVENOUS
  Filled 2017-01-01: qty 2

## 2017-01-01 MED ORDER — SODIUM CHLORIDE 0.9 % IR SOLN
Status: DC | PRN
  Administered 2017-01-01: 3000 mL

## 2017-01-01 MED ORDER — SUGAMMADEX SODIUM 200 MG/2ML IV SOLN
INTRAVENOUS | Status: DC | PRN
  Administered 2017-01-01: 200 mg via INTRAVENOUS

## 2017-01-01 MED ORDER — ONDANSETRON HCL 4 MG/2ML IJ SOLN
INTRAMUSCULAR | Status: AC
  Filled 2017-01-01: qty 2

## 2017-01-01 MED ORDER — OXYCODONE HCL 5 MG PO TABS
ORAL_TABLET | ORAL | Status: AC
  Filled 2017-01-01: qty 2

## 2017-01-01 MED ORDER — POVIDONE-IODINE 7.5 % EX SOLN
Freq: Once | CUTANEOUS | Status: DC
  Filled 2017-01-01: qty 118

## 2017-01-01 MED ORDER — SODIUM CHLORIDE 0.9% FLUSH
INTRAVENOUS | Status: DC | PRN
Start: 2017-01-01 — End: 2017-01-01
  Administered 2017-01-01: 40 mL

## 2017-01-01 MED ORDER — PHENYLEPHRINE HCL 10 MG/ML IJ SOLN
INTRAMUSCULAR | Status: DC | PRN
  Administered 2017-01-01: 40 ug/min via INTRAVENOUS

## 2017-01-01 MED ORDER — MORPHINE SULFATE (PF) 4 MG/ML IV SOLN: 1.0000 mg | INTRAVENOUS | Status: DC | PRN

## 2017-01-01 MED ORDER — LACTATED RINGERS IV SOLN
INTRAVENOUS | Status: DC
  Administered 2017-01-01 (×2): via INTRAVENOUS

## 2017-01-01 MED ORDER — OXYCODONE HCL 5 MG PO TABS
5.0000 mg | ORAL_TABLET | ORAL | Status: DC | PRN
  Administered 2017-01-01 – 2017-01-02 (×2): 10 mg via ORAL
  Filled 2017-01-01 (×2): qty 2

## 2017-01-01 MED ORDER — SODIUM CHLORIDE 0.9 % IV SOLN
INTRAVENOUS | Status: DC
  Administered 2017-01-01: 15:00:00 via INTRAVENOUS

## 2017-01-01 MED ORDER — ROCURONIUM BROMIDE 10 MG/ML (PF) SYRINGE
PREFILLED_SYRINGE | INTRAVENOUS | Status: DC | PRN
  Administered 2017-01-01: 10 mg via INTRAVENOUS
  Administered 2017-01-01: 50 mg via INTRAVENOUS

## 2017-01-01 MED ORDER — MIDAZOLAM HCL 2 MG/2ML IJ SOLN
INTRAMUSCULAR | Status: AC
  Filled 2017-01-01: qty 2

## 2017-01-01 MED ORDER — FENTANYL CITRATE (PF) 100 MCG/2ML IJ SOLN
50.0000 ug | Freq: Once | INTRAMUSCULAR | Status: AC
  Administered 2017-01-01: 50 ug via INTRAVENOUS

## 2017-01-01 MED ORDER — FENTANYL CITRATE (PF) 100 MCG/2ML IJ SOLN
INTRAMUSCULAR | Status: AC
  Filled 2017-01-01: qty 2

## 2017-01-01 MED ORDER — METOCLOPRAMIDE HCL 5 MG PO TABS: 5.0000 mg | ORAL_TABLET | Freq: Three times a day (TID) | ORAL | Status: DC | PRN

## 2017-01-01 MED ORDER — BISACODYL 5 MG PO TBEC: 5.0000 mg | DELAYED_RELEASE_TABLET | Freq: Every day | ORAL | Status: DC | PRN

## 2017-01-01 SURGICAL SUPPLY — 73 items
BASEPLATE P2 COATD GLND 6.5X30 (Shoulder) ×1 IMPLANT
BIT DRILL 5/64X5 DISP (BIT) ×3 IMPLANT
BLADE SAW SAG 73X25 THK (BLADE) ×2
BLADE SAW SGTL 73X25 THK (BLADE) ×1 IMPLANT
BLADE SURG 15 STRL LF DISP TIS (BLADE) ×3 IMPLANT
BLADE SURG 15 STRL SS (BLADE) ×6
BOWL SMART MIX CTS (DISPOSABLE) IMPLANT
CHLORAPREP W/TINT 26ML (MISCELLANEOUS) ×3 IMPLANT
CLOSURE WOUND 1/2 X4 (GAUZE/BANDAGES/DRESSINGS) ×1
COVER MAYO STAND STRL (DRAPES) IMPLANT
COVER SURGICAL LIGHT HANDLE (MISCELLANEOUS) ×3 IMPLANT
DRAPE INCISE IOBAN 66X45 STRL (DRAPES) ×3 IMPLANT
DRAPE ORTHO SPLIT 77X108 STRL (DRAPES) ×4
DRAPE SURG ORHT 6 SPLT 77X108 (DRAPES) ×2 IMPLANT
DRSG AQUACEL AG ADV 3.5X10 (GAUZE/BANDAGES/DRESSINGS) ×3 IMPLANT
ELECT BLADE 4.0 EZ CLEAN MEGAD (MISCELLANEOUS) ×3
ELECT REM PT RETURN 9FT ADLT (ELECTROSURGICAL) ×3
ELECTRODE BLDE 4.0 EZ CLN MEGD (MISCELLANEOUS) ×1 IMPLANT
ELECTRODE REM PT RTRN 9FT ADLT (ELECTROSURGICAL) ×1 IMPLANT
EVACUATOR 1/8 PVC DRAIN (DRAIN) IMPLANT
GLOVE BIO SURGEON STRL SZ7 (GLOVE) ×3 IMPLANT
GLOVE BIO SURGEON STRL SZ7.5 (GLOVE) ×3 IMPLANT
GLOVE BIOGEL PI IND STRL 7.0 (GLOVE) ×1 IMPLANT
GLOVE BIOGEL PI IND STRL 8 (GLOVE) ×1 IMPLANT
GLOVE BIOGEL PI INDICATOR 7.0 (GLOVE) ×2
GLOVE BIOGEL PI INDICATOR 8 (GLOVE) ×2
GOWN STRL REUS W/ TWL LRG LVL3 (GOWN DISPOSABLE) ×3 IMPLANT
GOWN STRL REUS W/ TWL XL LVL3 (GOWN DISPOSABLE) ×1 IMPLANT
GOWN STRL REUS W/TWL LRG LVL3 (GOWN DISPOSABLE) ×6
GOWN STRL REUS W/TWL XL LVL3 (GOWN DISPOSABLE) ×2
HANDPIECE INTERPULSE COAX TIP (DISPOSABLE) ×2
HOOD PEEL AWAY FLYTE STAYCOOL (MISCELLANEOUS) ×6 IMPLANT
INSERT SMALL SOCKET 32MM NEU (Insert) ×3 IMPLANT
KIT BASIN OR (CUSTOM PROCEDURE TRAY) ×3 IMPLANT
KIT ROOM TURNOVER OR (KITS) ×3 IMPLANT
MANIFOLD NEPTUNE II (INSTRUMENTS) ×3 IMPLANT
NEEDLE HYPO 25GX1X1/2 BEV (NEEDLE) IMPLANT
NEEDLE MAYO TROCAR (NEEDLE) IMPLANT
NS IRRIG 1000ML POUR BTL (IV SOLUTION) ×3 IMPLANT
P2 COATDE GLNOID BSEPLT 6.5X30 (Shoulder) ×3 IMPLANT
PACK SHOULDER (CUSTOM PROCEDURE TRAY) ×3 IMPLANT
PAD ARMBOARD 7.5X6 YLW CONV (MISCELLANEOUS) ×6 IMPLANT
RESTRAINT HEAD UNIVERSAL NS (MISCELLANEOUS) ×3 IMPLANT
RETRIEVER SUT HEWSON (MISCELLANEOUS) IMPLANT
SCREW BONE LOCKING RSP 5.0X30 (Screw) ×6 IMPLANT
SCREW BONE RSP LOCK 5X26 (Screw) ×2 IMPLANT
SCREW BONE RSP LOCK 5X30 (Screw) ×2 IMPLANT
SCREW BONE RSP LOCKING 5.0X26 (Screw) ×6 IMPLANT
SCREW RETAIN W/HEAD 4MM OFFSET (Shoulder) ×3 IMPLANT
SET HNDPC FAN SPRY TIP SCT (DISPOSABLE) ×1 IMPLANT
SLING ARM FOAM STRAP LRG (SOFTGOODS) ×3 IMPLANT
SLING ARM FOAM STRAP MED (SOFTGOODS) IMPLANT
SPONGE LAP 18X18 X RAY DECT (DISPOSABLE) ×3 IMPLANT
SPONGE LAP 4X18 X RAY DECT (DISPOSABLE) IMPLANT
STEM HUMERAL REV SHL 6X108 SM (Stem) ×3 IMPLANT
STRIP CLOSURE SKIN 1/2X4 (GAUZE/BANDAGES/DRESSINGS) ×2 IMPLANT
SUCTION FRAZIER HANDLE 10FR (MISCELLANEOUS) ×2
SUCTION TUBE FRAZIER 10FR DISP (MISCELLANEOUS) ×1 IMPLANT
SUPPORT WRAP ARM LG (MISCELLANEOUS) ×3 IMPLANT
SUT ETHIBOND NAB CT1 #1 30IN (SUTURE) ×3 IMPLANT
SUT FIBERWIRE #2 38 T-5 BLUE (SUTURE)
SUT MNCRL AB 4-0 PS2 18 (SUTURE) ×3 IMPLANT
SUT SILK 2 0 TIES 17X18 (SUTURE)
SUT SILK 2-0 18XBRD TIE BLK (SUTURE) IMPLANT
SUT VIC AB 2-0 CT1 27 (SUTURE) ×2
SUT VIC AB 2-0 CT1 TAPERPNT 27 (SUTURE) ×1 IMPLANT
SUTURE FIBERWR #2 38 T-5 BLUE (SUTURE) IMPLANT
SYR 30ML LL (SYRINGE) IMPLANT
SYRINGE TOOMEY DISP (SYRINGE) ×6 IMPLANT
TOWEL OR 17X24 6PK STRL BLUE (TOWEL DISPOSABLE) ×3 IMPLANT
TOWEL OR 17X26 10 PK STRL BLUE (TOWEL DISPOSABLE) ×3 IMPLANT
WATER STERILE IRR 1000ML POUR (IV SOLUTION) ×3 IMPLANT
YANKAUER SUCT BULB TIP NO VENT (SUCTIONS) IMPLANT

## 2017-01-01 NOTE — Anesthesia Postprocedure Evaluation (Signed)
Anesthesia Post Note  Patient: Lindsey Richardson  Procedure(s) Performed: Procedure(s) (LRB): REVERSE SHOULDER ARTHROPLASTY (Right)     Patient location during evaluation: PACU Anesthesia Type: General Level of consciousness: awake and alert Pain management: pain level controlled Vital Signs Assessment: post-procedure vital signs reviewed and stable Respiratory status: spontaneous breathing, nonlabored ventilation, respiratory function stable and patient connected to nasal cannula oxygen Cardiovascular status: blood pressure returned to baseline and stable Postop Assessment: no apparent nausea or vomiting Anesthetic complications: no    Last Vitals:  Vitals:   01/01/17 1315 01/01/17 1327  BP: 135/67   Pulse: 62 (!) 59  Resp: 12 13  Temp:    SpO2: 100% 100%    Last Pain:  Vitals:   01/01/17 1315  TempSrc:   PainSc: [place] Lindsey Richardson

## 2017-01-01 NOTE — Anesthesia Procedure Notes (Signed)
Procedure Name: Intubation Date/Time: 01/01/2017 10:09 AM Performed by: Freddie Breech Pre-anesthesia Checklist: Patient identified, Emergency Drugs available, Suction available and Patient being monitored Patient Re-evaluated:Patient Re-evaluated prior to induction Oxygen Delivery Method: Circle System Utilized Preoxygenation: Pre-oxygenation with 100% oxygen Induction Type: IV induction Ventilation: Mask ventilation without difficulty Laryngoscope Size: Mac and 3 Grade View: Grade II Tube type: Oral Tube size: 7.0 mm Number of attempts: 1 Airway Equipment and Method: Stylet and Oral airway Placement Confirmation: ETT inserted through vocal cords under direct vision,  positive ETCO2 and breath sounds checked- equal and bilateral Secured at: 21 cm Tube secured with: Tape Dental Injury: Teeth and Oropharynx as per pre-operative assessment

## 2017-01-01 NOTE — Transfer of Care (Signed)
Immediate Anesthesia Transfer of Care Note  Patient: Lindsey Richardson  Procedure(s) Performed: Procedure(s) with comments: REVERSE SHOULDER ARTHROPLASTY (Right) - Right reverse total shoulder arthroplasty  Patient Location: PACU  Anesthesia Type:GA combined with regional for post-op pain  Level of Consciousness: drowsy and patient cooperative  Airway & Oxygen Therapy: Patient Spontanous Breathing and Patient connected to face mask oxygen  Post-op Assessment: Report given to RN and Post -op Vital signs reviewed and stable  Post vital signs: Reviewed and stable  Last Vitals:  Vitals:   01/01/17 0940 01/01/17 0941  BP: (!) 130/47   Pulse: 65 65  Resp: 12 12  Temp:    SpO2: 99% 99%    Last Pain:  Vitals:   01/01/17 0847  TempSrc: Oral         Complications: No apparent anesthesia complications

## 2017-01-01 NOTE — Op Note (Signed)
Procedure(s): REVERSE SHOULDER ARTHROPLASTY Procedure Note  Lindsey Richardson female 82 y.o. 01/01/2017  Procedure(s) and Anesthesia Type:    * RIGHT REVERSE SHOULDER ARTHROPLASTY - Choice   Indications:  82 y.o. female  With right shoulder arthritis with massive irrepairable rotator cuff tear. Pain and dysfunction interfered with quality of life and nonoperative treatment with activity modification, NSAIDS and injections failed.     Surgeon: Nita Sells   Assistants: Jeanmarie Hubert PA-C [facility] was present and scrubbed throughout the procedure and was essential in positioning, retraction, exposure, and closure)  Anesthesia: General endotracheal anesthesia with preoperative interscalene block given by the attending anesthesiologist   Procedure Detail  REVERSE SHOULDER ARTHROPLASTY   Estimated Blood Loss:  200 mL         Drains: none  Blood Given: none          Specimens: none        Complications:  Glenoid fracture         Disposition: PACU - hemodynamically stable.         Condition: stable      OPERATIVE FINDINGS:  A DJO Altivate pressfit reverse total shoulder arthroplasty was placed with a  size 6 stem, a 32-4 glenosphere, and a standard poly insert. The base plate  fixation was very poor with extremely poor bone quality. Upon insertion of the inferior locking screw there is a fracture that occurred through the anterior inferior quadrant requiring moving the base plate posterior and superior. At this point I didn't feel I was able to get adequate fixation with 4 peripheral locking screws. I believe the 2 interlocking screws were into intact scapula with 2 into the area of fracture fragments.  PROCEDURE: The patient was identified in the preoperative holding area  where I personally marked the operative site after verifying site, side,  and procedure with the patient. An interscalene block given by  the attending anesthesiologist in the holding area  and the patient was taken back to the operating room where all extremities were  carefully padded in position after general anesthesia was induced. She  was placed in a beach-chair position and the operative upper extremity was  prepped and draped in a standard sterile fashion. An approximately 10-  cm incision was made from the tip of the coracoid process to the center  point of the humerus at the level of the axilla. Dissection was carried  down through subcutaneous tissues to the level of the cephalic vein  which was taken laterally with the deltoid. The pectoralis major was  retracted medially. The subdeltoid space was developed and the lateral  edge of the conjoined tendon was identified. The undersurface of  conjoined tendon was palpated and the musculocutaneous nerve was not in  the field. Retractor was placed underneath the conjoined and second  retractor was placed lateral into the deltoid. The circumflex humeral  artery and vessels were identified and clamped and coagulated. The  biceps tendon was absent.  The subscapularis was chronically torn and retracted.  The  joint was then gently externally rotated while the capsule was released  from the humeral neck around to just beyond the 6 o'clock position. At  this point, the joint was dislocated and the humeral head was presented  into the wound. Small osteophyte formation was removed with a  large rongeur.  The cutting guide was used to make the appropriate  head cut and the head was saved for potentially bone grafting.  The glenoid was exposed with the  arm in an  abducted extended position. The anterior and posterior labrum were  completely excised and the capsule was released circumferentially to  allow for exposure of the glenoid for preparation. The 2.5 mm drill was  placed using the guide in 5-10 inferior angulation and the tap was then advanced in the same hole. Small and large reamers were then used. The tap was then removed  and the Metaglene was then screwed in with very poor purchase. I felt that even though the fixation centrally was poor the peripheral screws would provide additional fixation. The peripheral guide was then used to drilled and measured. While inserting the most inferior locking screw upon engaging the base plate the torque caused fracture of the anterior-inferior glenoid and fixation of both the central screw in the inferior screw was compromised. The [facility] sphere was then removed and the glenoid was examined. Fracture involving the anterior-inferior quadrant but remainder the glenoid appeared intact and in continuity with the remainder of the scapula. I felt that proceeding with reverse shoulder arthroplasty as opposed to considering just a hemiarthroplasty was the best choice for this patient if adequate fixation could be achieved through the glenoid. Therefore the central position was moved posterior and superior and angled slightly more superiorly to get adequate bony purchase. The implant was then placed this time with a better purchase of the central screw. The peripheral screws were then placed. The size  32-4  glenosphere was then impacted on the [facility] taper and the central screw was placed. The construct did feel stable. The humerus was then again exposed and the diaphyseal reamers were used followed by the metaphyseal reamers. The final broach was left in place in the proximal trial was placed. The joint was reduced and with this implant it was felt that soft tissue tensioning was appropriate with excellent stability and excellent range of motion. Therefore, final humeral stem was placed press-fit with bone grafting.  And then the trial polyethylene inserts were tested again and the above implant was felt to be the most appropriate for final insertion. The joint was reduced taken through full range of motion and felt to be stable. Soft tissue tension was appropriate.  The joint was then copiously  irrigated with pulse  lavage and the wound was then closed. The subscapularis was not repaired.  Skin was closed with 2-0 Vicryl in a deep dermal layer and 4-0  Monocryl for skin closure. Steri-Strips were applied. Sterile  dressings were then applied as well as a sling. The patient was allowed  to awaken from general anesthesia, transferred to stretcher, and taken  to recovery room in stable condition.   POSTOPERATIVE PLAN: The patient will be kept in the hospital postoperatively  for pain control and therapy. We will keep her in a abduction pillow sling for 6 weeks to allow healing of the scapula prior to any motion of the shoulder.

## 2017-01-01 NOTE — Discharge Instructions (Signed)

## 2017-01-01 NOTE — H&P (Signed)
Lindsey Richardson is an 81 y.o. female.   Chief Complaint: R shoulder pain and dysfunction HPI: Endstage R shoulder arthritis with chronic rotator cuff disease significant pain and dysfunction, failed conservative measures.  Pain interferes with sleep and quality of life.   Past Medical History:  Diagnosis Date  . Arthritis   . GERD (gastroesophageal reflux disease)    pt denies  . Hyperlipidemia   . Migraine   . Osteopenia   . Osteoporosis   . Vitamin D deficiency     Past Surgical History:  Procedure Laterality Date  . HERNIA REPAIR      History reviewed. No pertinent family history. Social History:  reports that she has never smoked. She has never used smokeless tobacco. She reports that she does not drink alcohol or use drugs.  Allergies:  Allergies  Allergen Reactions  . Pork-Derived Products Other (See Comments)    Religious preference    Medications Prior to Admission  Medication Sig Dispense Refill  . Adalimumab (HUMIRA PEN [place]) Inject into the skin.    . fluticasone (FLONASE) 50 MCG/ACT nasal spray Place 2 sprays into both nostrils daily.  0    Results for orders placed or performed during the hospital encounter of 12/30/16 (from the past 48 hour(s))  Urinalysis, Routine w reflex microscopic     Status: Abnormal   Collection Time: 12/30/16  3:30 PM  Result Value Ref Range   Color, Urine STRAW (A) YELLOW   APPearance CLEAR CLEAR   Specific Gravity, Urine 1.006 1.005 - 1.030   pH 7.0 5.0 - 8.0   Glucose, UA NEGATIVE NEGATIVE mg/dL   Hgb urine dipstick NEGATIVE NEGATIVE   Bilirubin Urine NEGATIVE NEGATIVE   Ketones, ur NEGATIVE NEGATIVE mg/dL   Protein, ur NEGATIVE NEGATIVE mg/dL   Nitrite NEGATIVE NEGATIVE   Leukocytes, UA NEGATIVE NEGATIVE  APTT     Status: None   Collection Time: 12/30/16  3:31 PM  Result Value Ref Range   aPTT 32 24 - 36 seconds  CBC WITH DIFFERENTIAL     Status: None   Collection Time: 12/30/16  3:31 PM  Result Value Ref Range   WBC  6.4 4.0 - 10.5 K/uL   RBC 4.37 3.87 - 5.11 MIL/uL   Hemoglobin 12.9 12.0 - 15.0 g/dL   HCT 39.6 36.0 - 46.0 %   MCV 90.6 78.0 - 100.0 fL   MCH 29.5 26.0 - 34.0 pg   MCHC 32.6 30.0 - 36.0 g/dL   RDW 13.4 11.5 - 15.5 %   Platelets 270 150 - 400 K/uL   Neutrophils Relative % 51 %   Neutro Abs 3.2 1.7 - 7.7 K/uL   Lymphocytes Relative 39 %   Lymphs Abs 2.5 0.7 - 4.0 K/uL   Monocytes Relative 8 %   Monocytes Absolute 0.5 0.1 - 1.0 K/uL   Eosinophils Relative 2 %   Eosinophils Absolute 0.1 0.0 - 0.7 K/uL   Basophils Relative 0 %   Basophils Absolute 0.0 0.0 - 0.1 K/uL  Comprehensive metabolic panel     Status: Abnormal   Collection Time: 12/30/16  3:31 PM  Result Value Ref Range   Sodium 137 135 - 145 mmol/L   Potassium 4.5 3.5 - 5.1 mmol/L   Chloride 105 101 - 111 mmol/L   CO2 27 22 - 32 mmol/L   Glucose, Bld 94 65 - 99 mg/dL   BUN 9 6 - 20 mg/dL   Creatinine, Ser 0.53 0.44 - 1.00 mg/dL  Calcium 9.5 8.9 - 10.3 mg/dL   Total Protein 7.1 6.5 - 8.1 g/dL   Albumin 4.2 3.5 - 5.0 g/dL   AST 17 15 - 41 U/L   ALT 12 (L) 14 - 54 U/L   Alkaline Phosphatase 53 38 - 126 U/L   Total Bilirubin 0.7 0.3 - 1.2 mg/dL   GFR calc non Af Amer >60 >60 mL/min   GFR calc Af Amer >60 >60 mL/min    Comment: (NOTE) The eGFR has been calculated using the CKD EPI equation. This calculation has not been validated in all clinical situations. eGFR's persistently <60 mL/min signify possible Chronic Kidney Disease.    Anion gap 5 5 - 15  Protime-INR     Status: None   Collection Time: 12/30/16  3:31 PM  Result Value Ref Range   Prothrombin Time 13.6 11.4 - 15.2 seconds   INR 1.05   Surgical pcr screen     Status: Abnormal   Collection Time: 12/30/16  3:35 PM  Result Value Ref Range   MRSA, PCR NEGATIVE NEGATIVE   Staphylococcus aureus POSITIVE (A) NEGATIVE    Comment: (NOTE) The Xpert SA Assay (FDA approved for NASAL specimens in patients 64 years of age and older), is one component of a  comprehensive surveillance program. It is not intended to diagnose infection nor to guide or monitor treatment.    Dg Chest 2 View  Result Date: 12/31/2016 CLINICAL DATA:  Preoperative examination prior to shoulder surgery. No cardiopulmonary history. Nonsmoker. EXAM: CHEST  2 VIEW COMPARISON:  The chest x-ray dated May 16, 2009 FINDINGS: The lungs are adequately inflated. There is no focal infiltrate. There is stable subtle nodular density peripherally in the right mid to lower lung. There is no pleural effusion. The heart and pulmonary vascularity are normal. There is calcification in the wall of the aortic arch. There is mild multilevel degenerative disc disease of the thoracic spine. There is chronic prominence of the costal cartilages of the left fourth and fifth ribs. IMPRESSION: There is no acute cardiopulmonary abnormality. Thoracic aortic atherosclerosis. Electronically Signed   By: David  Martinique M.D.   On: 12/31/2016 08:16    Review of Systems  All other systems reviewed and are negative.   Blood pressure (!) 143/61, pulse 71, temperature 98 F (36.7 C), temperature source Oral, resp. rate 18, height '4\' 10"'  (1.473 m), weight 63.2 kg (139 lb 6.4 oz), SpO2 99 %. Physical Exam  Constitutional: She is oriented to person, place, and time. She appears well-developed and well-nourished.  HENT:  Head: Atraumatic.  Eyes: EOM are normal.  Cardiovascular: Intact distal pulses.   Respiratory: Effort normal.  Musculoskeletal:  R shoulder pain with limited ROM. NVID  Neurological: She is alert and oriented to person, place, and time.  Skin: Skin is warm and dry.  Psychiatric: She has a normal mood and affect.     Assessment/Plan R rotator cuff tear arthropathy, failed conservative management Plan R reverse TSA Risks / benefits of surgery discussed Consent on chart  NPO for OR Preop antibiotics   Nita Sells, MD 01/01/2017, 9:31 AM

## 2017-01-01 NOTE — Anesthesia Procedure Notes (Signed)
Anesthesia Regional Block: Interscalene brachial plexus block   Pre-Anesthetic Checklist: ,, timeout performed, Correct Patient, Correct Site, Correct Laterality, Correct Procedure, Correct Position, site marked, Risks and benefits discussed,  Surgical consent,  Pre-op evaluation,  At surgeon's request and post-op pain management  Laterality: Right  Prep: chloraprep       Needles:  Injection technique: Single-shot  Needle Type: Echogenic Needle     Needle Length: 5cm  Needle Gauge: 21     Additional Needles:   Narrative:  Start time: 01/01/2017 9:33 AM End time: 01/01/2017 9:37 AM Injection made incrementally with aspirations every 5 mL.  Performed by: Personally  Anesthesiologist: Renold Don E  Additional Notes: No pain on injection. No increased resistance to injection. Injection made in 5cc increments. Good needle visualization. Patient tolerated the procedure well.

## 2017-01-01 NOTE — Progress Notes (Signed)
Orthopedic Tech Progress Note Patient Details:  Lindsey Richardson 1934/12/28 [id number]  Ortho Devices Type of Ortho Device: Abduction pillow Ortho Device/Splint Location: shoulder abduction Ortho Device/Splint Interventions: Application   [place] Pink 01/01/2017, 12:50 PM

## 2017-01-02 ENCOUNTER — Encounter (HOSPITAL_COMMUNITY): Payer: Self-pay | Admitting: Orthopedic Surgery

## 2017-01-02 LAB — BASIC METABOLIC PANEL
Anion gap: 7 (ref 5–15)
BUN: 10 mg/dL (ref 6–20)
CALCIUM: 8.7 mg/dL — AB (ref 8.9–10.3)
CO2: 24 mmol/L (ref 22–32)
CREATININE: 0.63 mg/dL (ref 0.44–1.00)
Chloride: 106 mmol/L (ref 101–111)
GLUCOSE: 122 mg/dL — AB (ref 65–99)
POTASSIUM: 4.2 mmol/L (ref 3.5–5.1)
SODIUM: 137 mmol/L (ref 135–145)

## 2017-01-02 LAB — CBC
HCT: 32.1 % — ABNORMAL LOW (ref 36.0–46.0)
HEMOGLOBIN: 10.3 g/dL — AB (ref 12.0–15.0)
MCH: 28.9 pg (ref 26.0–34.0)
MCHC: 32.1 g/dL (ref 30.0–36.0)
MCV: 90.2 fL (ref 78.0–100.0)
PLATELETS: 203 10*3/uL (ref 150–400)
RBC: 3.56 MIL/uL — AB (ref 3.87–5.11)
RDW: 13.3 % (ref 11.5–15.5)
WBC: 9.8 10*3/uL (ref 4.0–10.5)

## 2017-01-02 LAB — GLUCOSE, CAPILLARY
GLUCOSE-CAPILLARY: 114 mg/dL — AB (ref 65–99)
Glucose-Capillary: 112 mg/dL — ABNORMAL HIGH (ref 65–99)

## 2017-01-02 MED ORDER — OXYCODONE-ACETAMINOPHEN 5-325 MG PO TABS: 1.0000 | ORAL_TABLET | ORAL | 0 refills | Status: DC | PRN

## 2017-01-02 NOTE — Discharge Summary (Signed)
Patient ID: Lindsey Richardson MRN: [id number] DOB/AGE: 1934-10-09 82 y.o.  Admit date: 01/01/2017 Discharge date: 01/02/2017  Admission Diagnoses:  Active Problems:   S/P reverse total shoulder arthroplasty, right   Discharge Diagnoses:  Same  Past Medical History:  Diagnosis Date  . Arthritis   . GERD (gastroesophageal reflux disease)    pt denies  . Hyperlipidemia   . Migraine   . Osteopenia   . Osteoporosis   . Vitamin D deficiency     Surgeries: Procedure(s): REVERSE SHOULDER ARTHROPLASTY on 01/01/2017   Consultants:   Discharged Condition: Improved  Hospital Course: Lindsey Richardson is an 82 y.o. female who was admitted 01/01/2017 for operative treatment of right shoulder dysfunction with chronic irreparable RCT. Patient has severe unremitting pain that affects sleep, daily activities, and work/hobbies. After pre-op clearance the patient was taken to the operating room on 01/01/2017 and underwent  Procedure(s): [place].    Patient was given perioperative antibiotics: Anti-infectives    Start     Dose/Rate Route Frequency Ordered Stop   01/01/17 1630  ceFAZolin (ANCEF) IVPB 1 g/50 mL premix     1 g 100 mL/hr over 30 Minutes Intravenous Every 6 hours 01/01/17 1419 01/02/17 0622   01/01/17 0907  ceFAZolin (ANCEF) 2-4 GM/100ML-% IVPB    Comments:  Schonewitz, Leigh   : cabinet override      01/01/17 0907 01/01/17 1015   01/01/17 0903  ceFAZolin (ANCEF) IVPB 2g/100 mL premix     2 g 200 mL/hr over 30 Minutes Intravenous On call to O.R. 01/01/17 0903 01/01/17 1020       Patient was given sequential compression devices, early ambulation, and asa to prevent DVT.  Patient benefited maximally from hospital stay and there were no complications.    Recent vital signs: Patient Vitals for the past 24 hrs:  BP Temp Temp src Pulse Resp SpO2 Height Weight  01/02/17 0631 (!) 107/57 97.6 F (36.4 C) Oral 60 16 96 % - -  01/01/17 2131 (!) 151/78 97.8 F (36.6  C) Oral 64 16 98 % - -  01/01/17 1430 107/63 97.9 F (36.6 C) Oral 87 16 97 % - -  01/01/17 1415 (!) 112/58 (!) 97.4 F (36.3 C) - 62 12 93 % - -  01/01/17 1345 119/62 - - 60 10 95 % - -  01/01/17 1330 106/73 - - (!) 57 12 99 % - -  01/01/17 1327 - - - (!) 59 13 100 % - -  01/01/17 1315 135/67 - - 62 12 100 % - -  01/01/17 1245 (!) 142/75 - - 63 14 98 % - -  01/01/17 1240 - - - 63 12 98 % - -  01/01/17 1230 (!) 150/98 - - 65 14 99 % - -  01/01/17 1217 - - Tympanic - - - - -  01/01/17 1215 (!) 145/68 (!) 97.4 F (36.3 C) - 70 15 100 % - -  01/01/17 1213 - - - 70 - 100 % - -  01/01/17 0941 - - - 65 12 99 % - -  01/01/17 0940 (!) 130/47 - - 65 12 99 % - -  01/01/17 0935 - - - (!) 59 15 100 % - -  01/01/17 0930 - - - (!) 59 20 100 % - -  01/01/17 0847 (!) 143/61 98 F (36.7 C) Oral 71 18 99 % 4\' 10"  (1.473 m) 63.2 kg (139 lb 6.4 oz)     Recent laboratory studies:  Recent Labs  12/30/16 1531 01/02/17 0617  WBC 6.4 9.8  HGB 12.9 10.3*  HCT 39.6 32.1*  PLT 270 203  NA 137  --   K 4.5  --   CL 105  --   CO2 27  --   BUN 9  --   CREATININE 0.53  --   GLUCOSE 94  --   INR 1.05  --   CALCIUM 9.5  --      Discharge Medications:   Allergies as of 01/02/2017      Reactions   Pork-derived Products Other (See Comments)   Religious preference      Medication List    TAKE these medications   fluticasone 50 MCG/ACT nasal spray Commonly known as:  FLONASE Place 2 sprays into both nostrils daily.   HUMIRA PEN [place] Inject into the skin.   oxyCODONE-acetaminophen 5-325 MG tablet Commonly known as:  ROXICET Take 1-2 tablets by mouth every 4 (four) hours as needed for severe pain.            Discharge Care Instructions        Start     Ordered   01/02/17 0000  Call MD / Call 911    Comments:  If you experience chest pain or shortness of breath, CALL 911 and be transported to the hospital emergency room.  If you develope a fever above 101 F, pus (white drainage) or  increased drainage or redness at the wound, or calf pain, call your surgeon's office.   01/02/17 0801   01/02/17 0000  Diet - low sodium heart healthy     01/02/17 0801   01/02/17 0000  Constipation Prevention    Comments:  Drink plenty of fluids.  Prune juice may be helpful.  You may use a stool softener, such as Colace (over the counter) 100 mg twice a day.  Use MiraLax (over the counter) for constipation as needed.   01/02/17 0801   01/02/17 0000  Increase activity slowly as tolerated     01/02/17 0801   01/02/17 0000  oxyCODONE-acetaminophen (ROXICET) 5-325 MG tablet  Every 4 hours PRN     01/02/17 0801      Diagnostic Studies: Dg Chest 2 View  Result Date: 12/31/2016 CLINICAL DATA:  Preoperative examination prior to shoulder surgery. No cardiopulmonary history. Nonsmoker. EXAM: CHEST  2 VIEW COMPARISON:  The chest x-ray dated May 16, 2009 FINDINGS: The lungs are adequately inflated. There is no focal infiltrate. There is stable subtle nodular density peripherally in the right mid to lower lung. There is no pleural effusion. The heart and pulmonary vascularity are normal. There is calcification in the wall of the aortic arch. There is mild multilevel degenerative disc disease of the thoracic spine. There is chronic prominence of the costal cartilages of the left fourth and fifth ribs. IMPRESSION: There is no acute cardiopulmonary abnormality. Thoracic aortic atherosclerosis. Electronically Signed   By: David  Martinique M.D.   On: 12/31/2016 08:16   Dg Shoulder Right Port  Result Date: 01/01/2017 CLINICAL DATA:  Right total shoulder arthroplasty EXAM: PORTABLE RIGHT SHOULDER COMPARISON:  12/30/2016 chest radiograph FINDINGS: Right total shoulder arthroplasty identified. No definite complicating features. No dislocation identified. IMPRESSION: Right total shoulder arthroplasty without definite complicating features. Electronically Signed   By: Margarette Canada M.D.   On: 01/01/2017 12:36     Disposition: 07-Left Against Medical Advice/Left Without Being Seen/Elopement  Discharge Instructions    Call MD / Call 911  Complete by:  As directed    If you experience chest pain or shortness of breath, CALL 911 and be transported to the hospital emergency room.  If you develope a fever above 101 F, pus (white drainage) or increased drainage or redness at the wound, or calf pain, call your surgeon's office.   Constipation Prevention    Complete by:  As directed    Drink plenty of fluids.  Prune juice may be helpful.  You may use a stool softener, such as Colace (over the counter) 100 mg twice a day.  Use MiraLax (over the counter) for constipation as needed.   Diet - low sodium heart healthy    Complete by:  As directed    Increase activity slowly as tolerated    Complete by:  As directed       Follow-up Information    Tania Ade, MD. Schedule an appointment as soon as possible for a visit in 2 weeks.   Specialty:  Orthopedic Surgery Contact information: [address] [place] [address] [phone]            Signed: Grier Mitts 01/02/2017, 8:39 AM

## 2017-01-02 NOTE — Progress Notes (Signed)
   PATIENT ID: Lindsey Richardson   1 Day Post-Op Procedure(s) (LRB): REVERSE SHOULDER ARTHROPLASTY (Right)  Subjective: Doing well, min pain overnight, still feeling some effects from the block per daughter. Not wearing her sling.   Objective:  Vitals:   01/01/17 2131 01/02/17 0631  BP: (!) 151/78 (!) 107/57  Pulse: 64 60  Resp: 16 16  Temp: 97.8 F (36.6 C) 97.6 F (36.4 C)  SpO2: 98% 96%     R UE dressing c/d/i Wiggles fingers, distally NVI  Labs:   Recent Labs  12/30/16 1531 01/02/17 0617  HGB 12.9 10.3*   Recent Labs  12/30/16 1531 01/02/17 0617  WBC 6.4 9.8  RBC 4.37 3.56*  HCT 39.6 32.1*  PLT 270 203   Recent Labs  12/30/16 1531  NA 137  K 4.5  CL 105  CO2 27  BUN 9  CREATININE 0.53  GLUCOSE 94  CALCIUM 9.5    Assessment and Plan: 1 day s/p R reverse TSA No OT for this patient, tx in sling for 6 weeks Must wear sling immobilizer at all times, repositioned today D/c home today when ready/pain controlled Follow up with Dr. Tamera Punt in 2 weeks, scripts in chart  VTE proph: ASA, SCDs

## 2017-01-02 NOTE — Progress Notes (Signed)
Pt discharged via wheelchair. Pt belongings with pt. Pt is not in distress.

## 2017-01-02 NOTE — Progress Notes (Signed)
Pt discharge instructions reviewed with pt and her daughter using an interpreter on the [employer] video. Pt and daughter verbalize understanding. Pt will be discharged when her ride arrives.

## 2017-01-09 DIAGNOSIS — Z471 Aftercare following joint replacement surgery: Secondary | ICD-10-CM | POA: Diagnosis not present

## 2017-01-09 DIAGNOSIS — M19011 Primary osteoarthritis, right shoulder: Secondary | ICD-10-CM | POA: Diagnosis not present

## 2017-01-09 DIAGNOSIS — H04553 Acquired stenosis of bilateral nasolacrimal duct: Secondary | ICD-10-CM | POA: Diagnosis not present

## 2017-01-09 DIAGNOSIS — Z96611 Presence of right artificial shoulder joint: Secondary | ICD-10-CM | POA: Diagnosis not present

## 2017-01-09 DIAGNOSIS — H04201 Unspecified epiphora, right lacrimal gland: Secondary | ICD-10-CM | POA: Diagnosis not present

## 2017-01-09 DIAGNOSIS — L121 Cicatricial pemphigoid: Secondary | ICD-10-CM | POA: Diagnosis not present

## 2017-01-09 DIAGNOSIS — Z9889 Other specified postprocedural states: Secondary | ICD-10-CM | POA: Diagnosis not present

## 2017-02-04 DIAGNOSIS — M19011 Primary osteoarthritis, right shoulder: Secondary | ICD-10-CM | POA: Diagnosis not present

## 2017-02-13 DIAGNOSIS — R350 Frequency of micturition: Secondary | ICD-10-CM | POA: Diagnosis not present

## 2017-03-06 DIAGNOSIS — S53402A Unspecified sprain of left elbow, initial encounter: Secondary | ICD-10-CM | POA: Diagnosis not present

## 2017-03-06 DIAGNOSIS — S43402A Unspecified sprain of left shoulder joint, initial encounter: Secondary | ICD-10-CM | POA: Diagnosis not present

## 2017-03-13 DIAGNOSIS — M75122 Complete rotator cuff tear or rupture of left shoulder, not specified as traumatic: Secondary | ICD-10-CM | POA: Diagnosis not present

## 2017-03-13 DIAGNOSIS — M19011 Primary osteoarthritis, right shoulder: Secondary | ICD-10-CM | POA: Diagnosis not present

## 2017-03-18 DIAGNOSIS — M19011 Primary osteoarthritis, right shoulder: Secondary | ICD-10-CM | POA: Diagnosis not present

## 2017-03-24 DIAGNOSIS — Z961 Presence of intraocular lens: Secondary | ICD-10-CM | POA: Diagnosis not present

## 2017-03-24 DIAGNOSIS — L121 Cicatricial pemphigoid: Secondary | ICD-10-CM | POA: Diagnosis not present

## 2017-03-24 DIAGNOSIS — H04203 Unspecified epiphora, bilateral lacrimal glands: Secondary | ICD-10-CM | POA: Diagnosis not present

## 2017-03-25 DIAGNOSIS — M25512 Pain in left shoulder: Secondary | ICD-10-CM | POA: Diagnosis not present

## 2017-03-25 DIAGNOSIS — M75122 Complete rotator cuff tear or rupture of left shoulder, not specified as traumatic: Secondary | ICD-10-CM | POA: Diagnosis not present

## 2017-03-25 DIAGNOSIS — Z96611 Presence of right artificial shoulder joint: Secondary | ICD-10-CM | POA: Diagnosis not present

## 2017-03-25 DIAGNOSIS — M25511 Pain in right shoulder: Secondary | ICD-10-CM | POA: Diagnosis not present

## 2017-04-02 DIAGNOSIS — Z96611 Presence of right artificial shoulder joint: Secondary | ICD-10-CM | POA: Diagnosis not present

## 2017-04-02 DIAGNOSIS — M75122 Complete rotator cuff tear or rupture of left shoulder, not specified as traumatic: Secondary | ICD-10-CM | POA: Diagnosis not present

## 2017-04-02 DIAGNOSIS — M25512 Pain in left shoulder: Secondary | ICD-10-CM | POA: Diagnosis not present

## 2017-04-02 DIAGNOSIS — M25511 Pain in right shoulder: Secondary | ICD-10-CM | POA: Diagnosis not present

## 2017-04-10 DIAGNOSIS — H353132 Nonexudative age-related macular degeneration, bilateral, intermediate dry stage: Secondary | ICD-10-CM | POA: Diagnosis not present

## 2017-04-10 DIAGNOSIS — Z961 Presence of intraocular lens: Secondary | ICD-10-CM | POA: Diagnosis not present

## 2017-04-10 DIAGNOSIS — L121 Cicatricial pemphigoid: Secondary | ICD-10-CM | POA: Diagnosis not present

## 2017-04-13 DIAGNOSIS — Z96611 Presence of right artificial shoulder joint: Secondary | ICD-10-CM | POA: Diagnosis not present

## 2017-04-13 DIAGNOSIS — M75122 Complete rotator cuff tear or rupture of left shoulder, not specified as traumatic: Secondary | ICD-10-CM | POA: Diagnosis not present

## 2017-04-13 DIAGNOSIS — M25571 Pain in right ankle and joints of right foot: Secondary | ICD-10-CM | POA: Diagnosis not present

## 2017-04-13 DIAGNOSIS — R51 Headache: Secondary | ICD-10-CM | POA: Diagnosis not present

## 2017-04-13 DIAGNOSIS — R413 Other amnesia: Secondary | ICD-10-CM | POA: Diagnosis not present

## 2017-04-13 DIAGNOSIS — M25512 Pain in left shoulder: Secondary | ICD-10-CM | POA: Diagnosis not present

## 2017-04-13 DIAGNOSIS — M25511 Pain in right shoulder: Secondary | ICD-10-CM | POA: Diagnosis not present

## 2017-04-15 DIAGNOSIS — M75122 Complete rotator cuff tear or rupture of left shoulder, not specified as traumatic: Secondary | ICD-10-CM | POA: Diagnosis not present

## 2017-04-15 DIAGNOSIS — Z96611 Presence of right artificial shoulder joint: Secondary | ICD-10-CM | POA: Diagnosis not present

## 2017-04-15 DIAGNOSIS — M25511 Pain in right shoulder: Secondary | ICD-10-CM | POA: Diagnosis not present

## 2017-04-15 DIAGNOSIS — M25512 Pain in left shoulder: Secondary | ICD-10-CM | POA: Diagnosis not present

## 2017-04-16 ENCOUNTER — Other Ambulatory Visit: Payer: Self-pay | Admitting: Family Medicine

## 2017-04-16 DIAGNOSIS — R51 Headache: Principal | ICD-10-CM

## 2017-04-16 DIAGNOSIS — R519 Headache, unspecified: Secondary | ICD-10-CM

## 2017-04-17 DIAGNOSIS — M25572 Pain in left ankle and joints of left foot: Secondary | ICD-10-CM | POA: Diagnosis not present

## 2017-04-20 ENCOUNTER — Ambulatory Visit
Admission: RE | Admit: 2017-04-20 | Discharge: 2017-04-20 | Disposition: A | Discharge status: Home / Self Care | Payer: Medicare Other | Source: Ambulatory Visit | Attending: Family Medicine | Admitting: Family Medicine

## 2017-04-20 DIAGNOSIS — R519 Headache, unspecified: Secondary | ICD-10-CM

## 2017-04-20 DIAGNOSIS — R413 Other amnesia: Secondary | ICD-10-CM | POA: Diagnosis not present

## 2017-04-20 DIAGNOSIS — M25511 Pain in right shoulder: Secondary | ICD-10-CM | POA: Diagnosis not present

## 2017-04-20 DIAGNOSIS — R51 Headache: Principal | ICD-10-CM

## 2017-04-20 DIAGNOSIS — Z96611 Presence of right artificial shoulder joint: Secondary | ICD-10-CM | POA: Diagnosis not present

## 2017-04-20 DIAGNOSIS — M25512 Pain in left shoulder: Secondary | ICD-10-CM | POA: Diagnosis not present

## 2017-04-20 DIAGNOSIS — M75122 Complete rotator cuff tear or rupture of left shoulder, not specified as traumatic: Secondary | ICD-10-CM | POA: Diagnosis not present

## 2017-04-22 DIAGNOSIS — Z96611 Presence of right artificial shoulder joint: Secondary | ICD-10-CM | POA: Diagnosis not present

## 2017-04-22 DIAGNOSIS — M25511 Pain in right shoulder: Secondary | ICD-10-CM | POA: Diagnosis not present

## 2017-04-22 DIAGNOSIS — M75122 Complete rotator cuff tear or rupture of left shoulder, not specified as traumatic: Secondary | ICD-10-CM | POA: Diagnosis not present

## 2017-04-22 DIAGNOSIS — M25512 Pain in left shoulder: Secondary | ICD-10-CM | POA: Diagnosis not present

## 2017-04-24 DIAGNOSIS — M79674 Pain in right toe(s): Secondary | ICD-10-CM | POA: Diagnosis not present

## 2017-04-24 DIAGNOSIS — R209 Unspecified disturbances of skin sensation: Secondary | ICD-10-CM | POA: Diagnosis not present

## 2017-04-24 DIAGNOSIS — R0982 Postnasal drip: Secondary | ICD-10-CM | POA: Diagnosis not present

## 2017-04-24 DIAGNOSIS — R1310 Dysphagia, unspecified: Secondary | ICD-10-CM | POA: Diagnosis not present

## 2017-04-30 DIAGNOSIS — Z79899 Other long term (current) drug therapy: Secondary | ICD-10-CM | POA: Diagnosis not present

## 2017-04-30 DIAGNOSIS — M810 Age-related osteoporosis without current pathological fracture: Secondary | ICD-10-CM | POA: Diagnosis not present

## 2017-04-30 DIAGNOSIS — G8929 Other chronic pain: Secondary | ICD-10-CM | POA: Diagnosis not present

## 2017-04-30 DIAGNOSIS — M25511 Pain in right shoulder: Secondary | ICD-10-CM | POA: Diagnosis not present

## 2017-04-30 DIAGNOSIS — M797 Fibromyalgia: Secondary | ICD-10-CM | POA: Diagnosis not present

## 2017-04-30 DIAGNOSIS — M0589 Other rheumatoid arthritis with rheumatoid factor of multiple sites: Secondary | ICD-10-CM | POA: Diagnosis not present

## 2017-04-30 DIAGNOSIS — M79672 Pain in left foot: Secondary | ICD-10-CM | POA: Diagnosis not present

## 2017-04-30 DIAGNOSIS — M150 Primary generalized (osteo)arthritis: Secondary | ICD-10-CM | POA: Diagnosis not present

## 2017-05-03 ENCOUNTER — Other Ambulatory Visit: Payer: Self-pay | Admitting: Rheumatology

## 2017-05-03 DIAGNOSIS — M79672 Pain in left foot: Secondary | ICD-10-CM

## 2017-05-11 ENCOUNTER — Other Ambulatory Visit: Payer: Self-pay

## 2017-05-18 ENCOUNTER — Ambulatory Visit
Admission: RE | Admit: 2017-05-18 | Discharge: 2017-05-18 | Disposition: A | Discharge status: Home / Self Care | Payer: Medicare Other | Source: Ambulatory Visit | Attending: Rheumatology | Admitting: Rheumatology

## 2017-05-18 DIAGNOSIS — M79671 Pain in right foot: Secondary | ICD-10-CM | POA: Diagnosis not present

## 2017-05-18 DIAGNOSIS — M79672 Pain in left foot: Secondary | ICD-10-CM

## 2017-05-28 DIAGNOSIS — G8929 Other chronic pain: Secondary | ICD-10-CM | POA: Diagnosis not present

## 2017-05-28 DIAGNOSIS — M1990 Unspecified osteoarthritis, unspecified site: Secondary | ICD-10-CM | POA: Diagnosis not present

## 2017-05-28 DIAGNOSIS — Z79899 Other long term (current) drug therapy: Secondary | ICD-10-CM | POA: Diagnosis not present

## 2017-05-28 DIAGNOSIS — M549 Dorsalgia, unspecified: Secondary | ICD-10-CM | POA: Diagnosis not present

## 2017-06-08 DIAGNOSIS — M1990 Unspecified osteoarthritis, unspecified site: Secondary | ICD-10-CM | POA: Diagnosis not present

## 2017-06-08 DIAGNOSIS — G8929 Other chronic pain: Secondary | ICD-10-CM | POA: Diagnosis not present

## 2017-06-08 DIAGNOSIS — M549 Dorsalgia, unspecified: Secondary | ICD-10-CM | POA: Diagnosis not present

## 2017-06-08 DIAGNOSIS — Z79899 Other long term (current) drug therapy: Secondary | ICD-10-CM | POA: Diagnosis not present

## 2017-06-11 DIAGNOSIS — R350 Frequency of micturition: Secondary | ICD-10-CM | POA: Diagnosis not present

## 2017-06-11 DIAGNOSIS — M79675 Pain in left toe(s): Secondary | ICD-10-CM | POA: Diagnosis not present

## 2017-06-17 DIAGNOSIS — Z471 Aftercare following joint replacement surgery: Secondary | ICD-10-CM | POA: Diagnosis not present

## 2017-06-17 DIAGNOSIS — M25511 Pain in right shoulder: Secondary | ICD-10-CM | POA: Diagnosis not present

## 2017-06-17 DIAGNOSIS — Z96611 Presence of right artificial shoulder joint: Secondary | ICD-10-CM | POA: Diagnosis not present

## 2017-06-22 DIAGNOSIS — M79675 Pain in left toe(s): Secondary | ICD-10-CM | POA: Diagnosis not present

## 2017-06-24 DIAGNOSIS — Z961 Presence of intraocular lens: Secondary | ICD-10-CM | POA: Diagnosis not present

## 2017-06-24 DIAGNOSIS — H538 Other visual disturbances: Secondary | ICD-10-CM | POA: Diagnosis not present

## 2017-06-24 DIAGNOSIS — H02054 Trichiasis without entropian left upper eyelid: Secondary | ICD-10-CM | POA: Diagnosis not present

## 2017-06-24 DIAGNOSIS — L121 Cicatricial pemphigoid: Secondary | ICD-10-CM | POA: Diagnosis not present

## 2017-06-24 DIAGNOSIS — H02051 Trichiasis without entropian right upper eyelid: Secondary | ICD-10-CM | POA: Diagnosis not present

## 2017-06-24 DIAGNOSIS — H04203 Unspecified epiphora, bilateral lacrimal glands: Secondary | ICD-10-CM | POA: Diagnosis not present

## 2017-06-29 DIAGNOSIS — M79675 Pain in left toe(s): Secondary | ICD-10-CM | POA: Diagnosis not present

## 2017-12-25 DIAGNOSIS — M79675 Pain in left toe(s): Secondary | ICD-10-CM | POA: Diagnosis not present

## 2017-12-25 DIAGNOSIS — Z471 Aftercare following joint replacement surgery: Secondary | ICD-10-CM | POA: Diagnosis not present

## 2017-12-25 DIAGNOSIS — Z96611 Presence of right artificial shoulder joint: Secondary | ICD-10-CM | POA: Diagnosis not present

## 2017-12-29 DIAGNOSIS — M25569 Pain in unspecified knee: Secondary | ICD-10-CM | POA: Diagnosis not present

## 2017-12-29 DIAGNOSIS — R350 Frequency of micturition: Secondary | ICD-10-CM | POA: Diagnosis not present

## 2017-12-29 DIAGNOSIS — Z23 Encounter for immunization: Secondary | ICD-10-CM | POA: Diagnosis not present

## 2018-01-02 IMAGING — CR DG CHEST 2V
2 series · 2 of 2 positions shown · non-contrast
Comparison: The chest x-ray dated May 16, 2009

CLINICAL DATA: Preoperative examination prior to shoulder surgery.
No cardiopulmonary history. Nonsmoker.

EXAM:
CHEST  2 VIEW

[w chest pa]
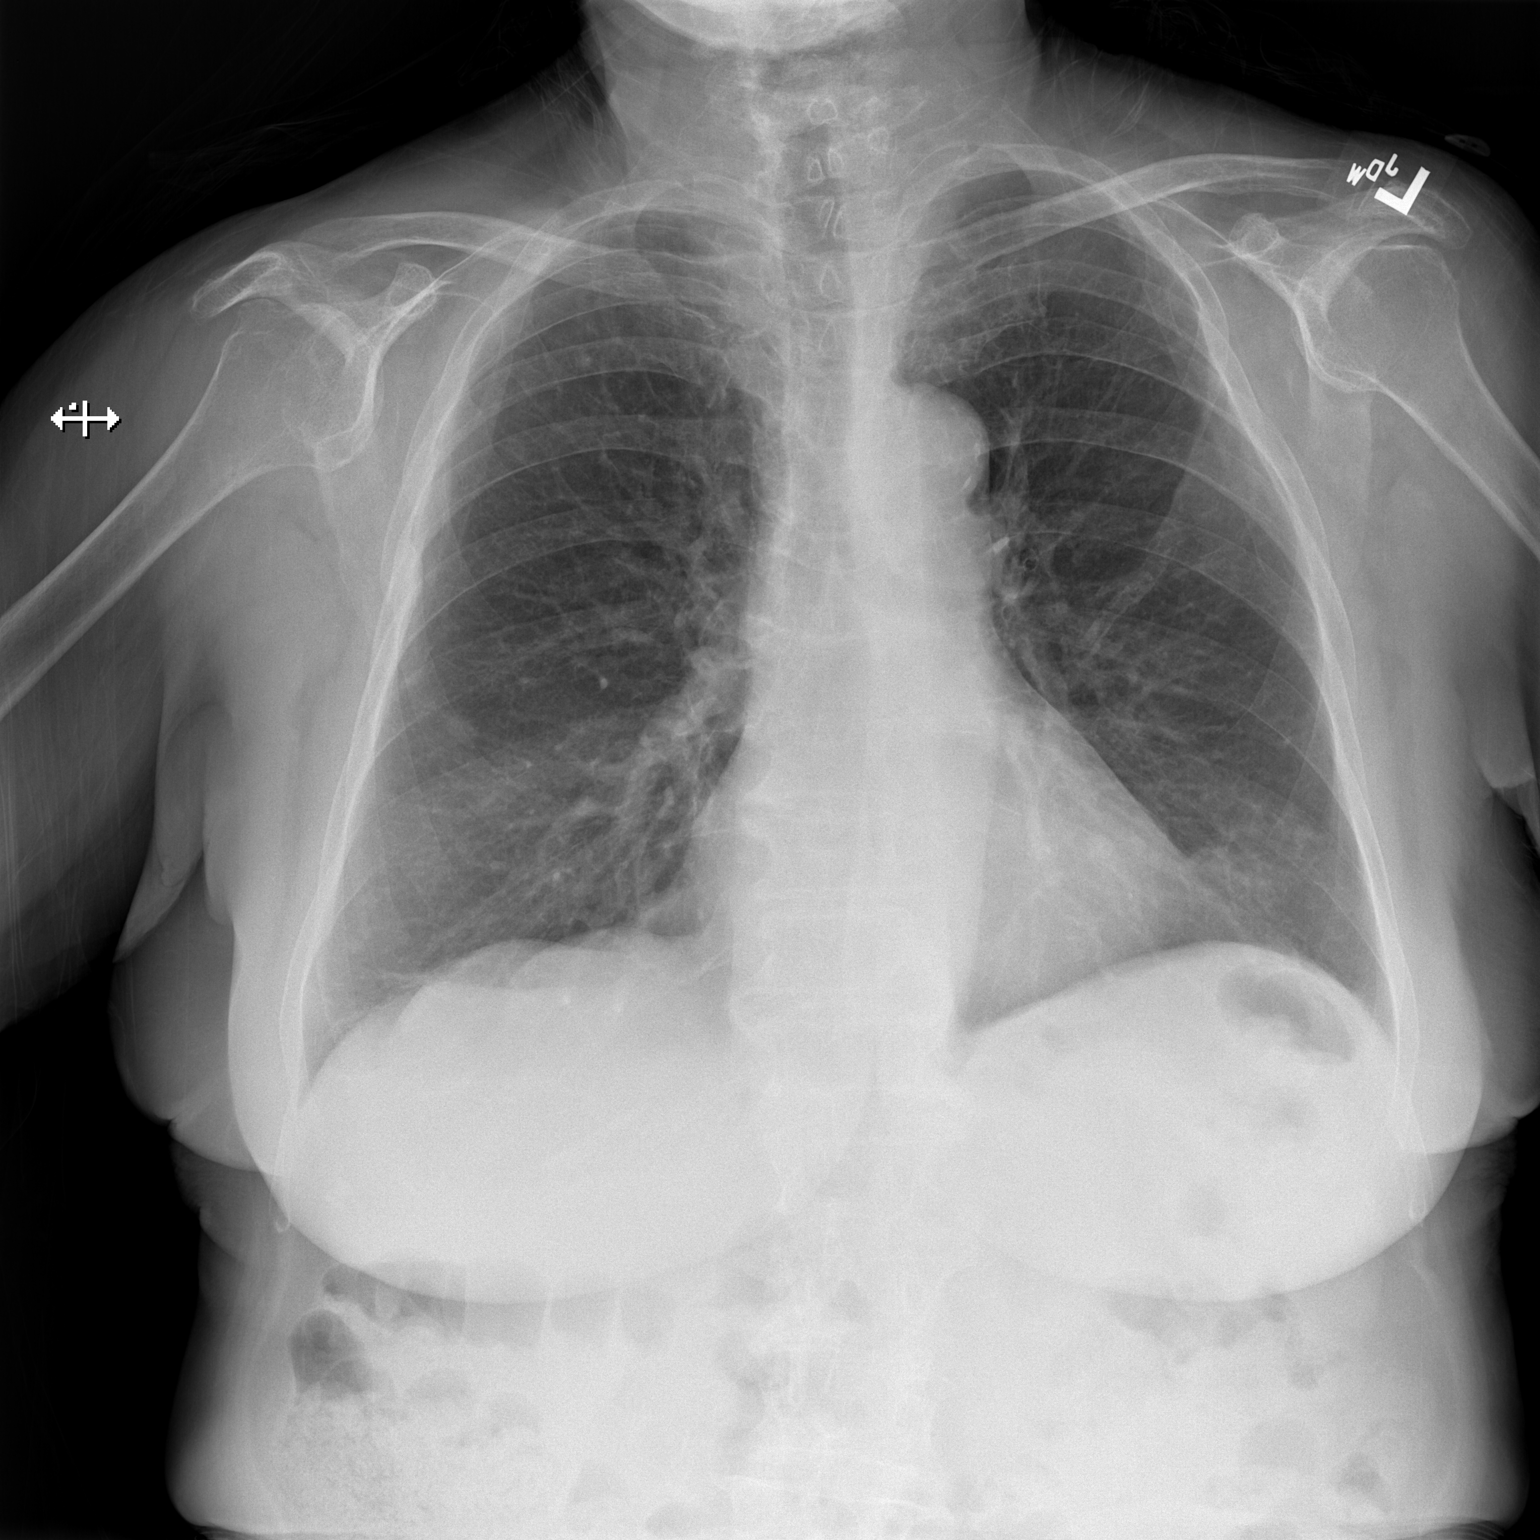

[w chest lat]
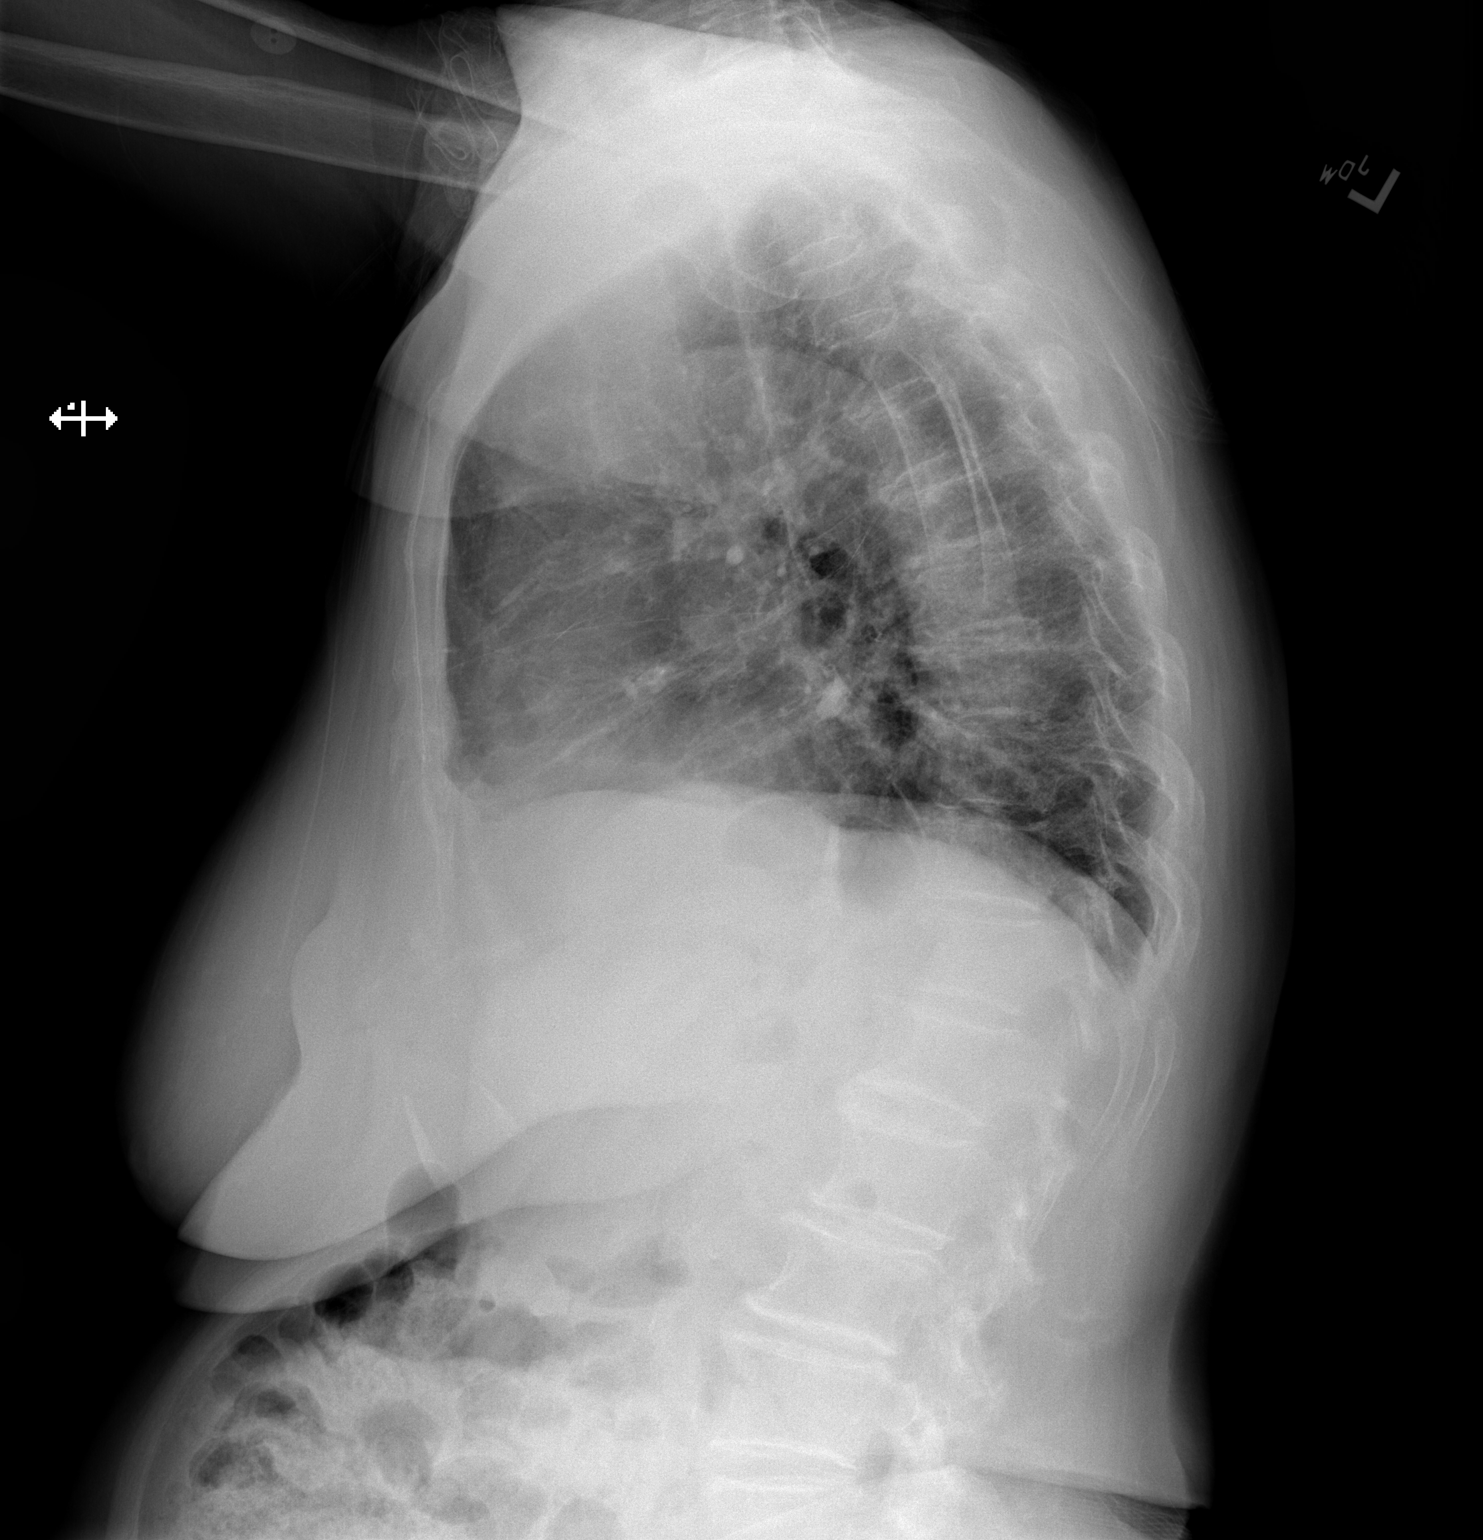

[2 of 2 positions shown; findings below may reference images not displayed]

FINDINGS: The lungs are adequately inflated. There is no focal infiltrate.
There is stable subtle nodular density peripherally in the right mid
to lower lung. There is no pleural effusion. The heart and pulmonary
vascularity are normal. There is calcification in the wall of the
aortic arch. There is mild multilevel degenerative disc disease of
the thoracic spine. There is chronic prominence of the costal
cartilages of the left fourth and fifth ribs.
IMPRESSION: There is no acute cardiopulmonary abnormality.

Thoracic aortic atherosclerosis.

## 2018-01-04 IMAGING — DX DG SHOULDER 2+V PORT*R*
2 series · 2 of 2 positions shown · non-contrast
Comparison: 12/30/2016 chest radiograph

CLINICAL DATA: Right total shoulder arthroplasty

EXAM:
PORTABLE RIGHT SHOULDER

[shoulder ap (1 of 2)]
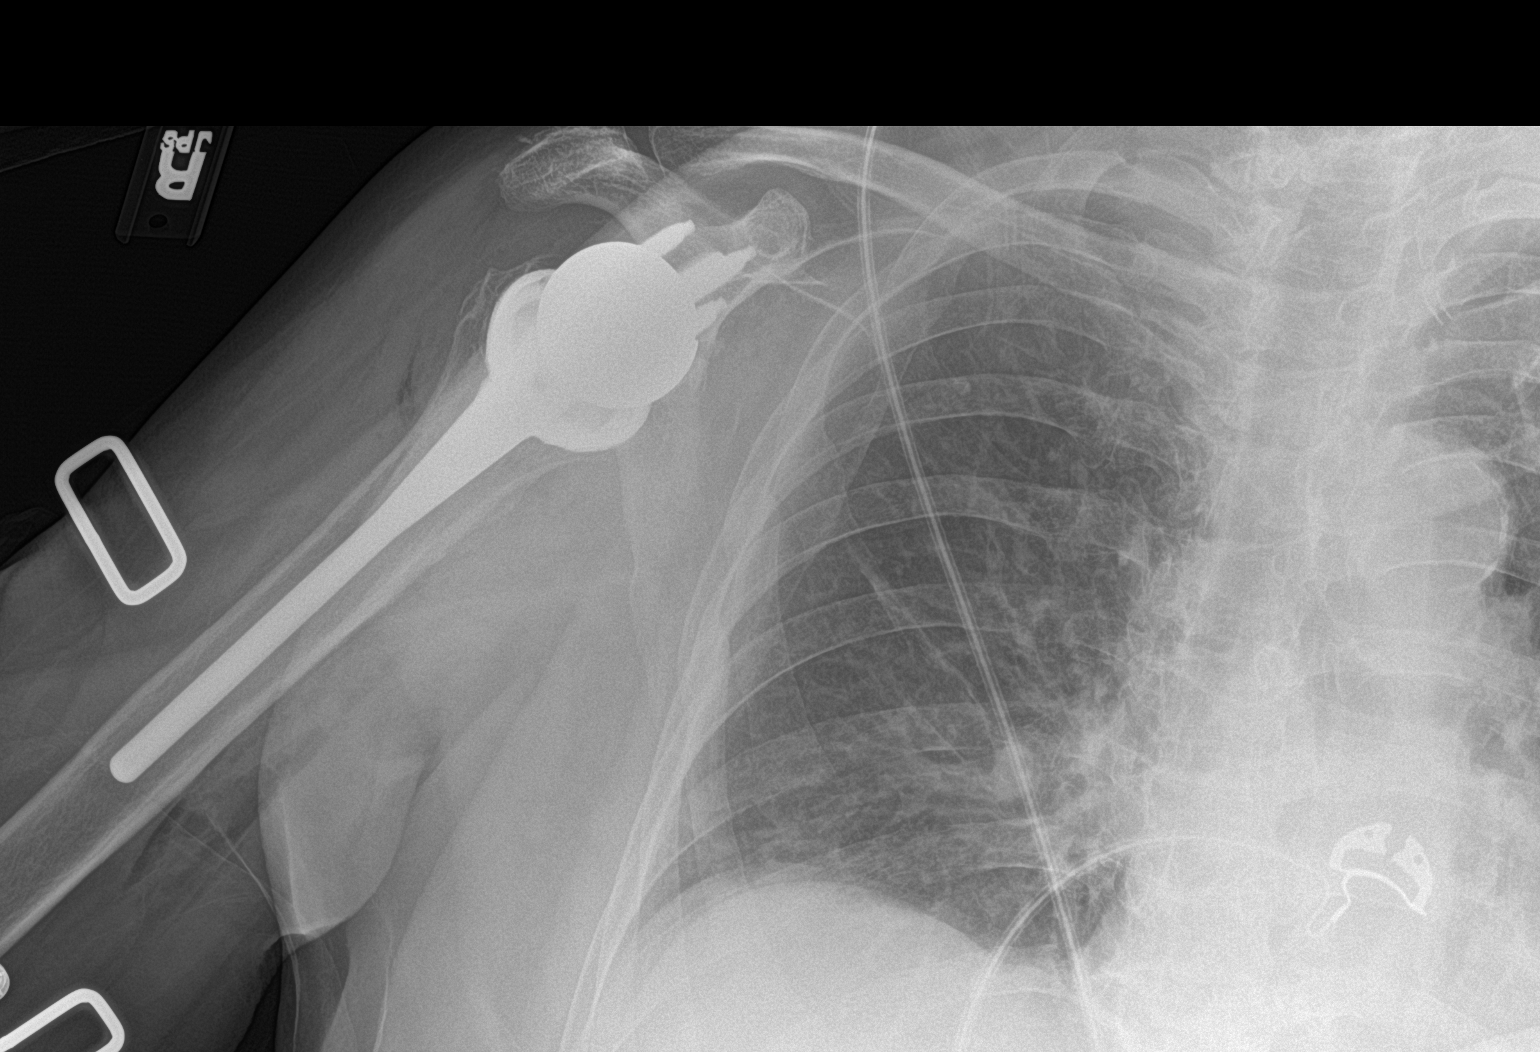

[shoulder ap (2 of 2)]
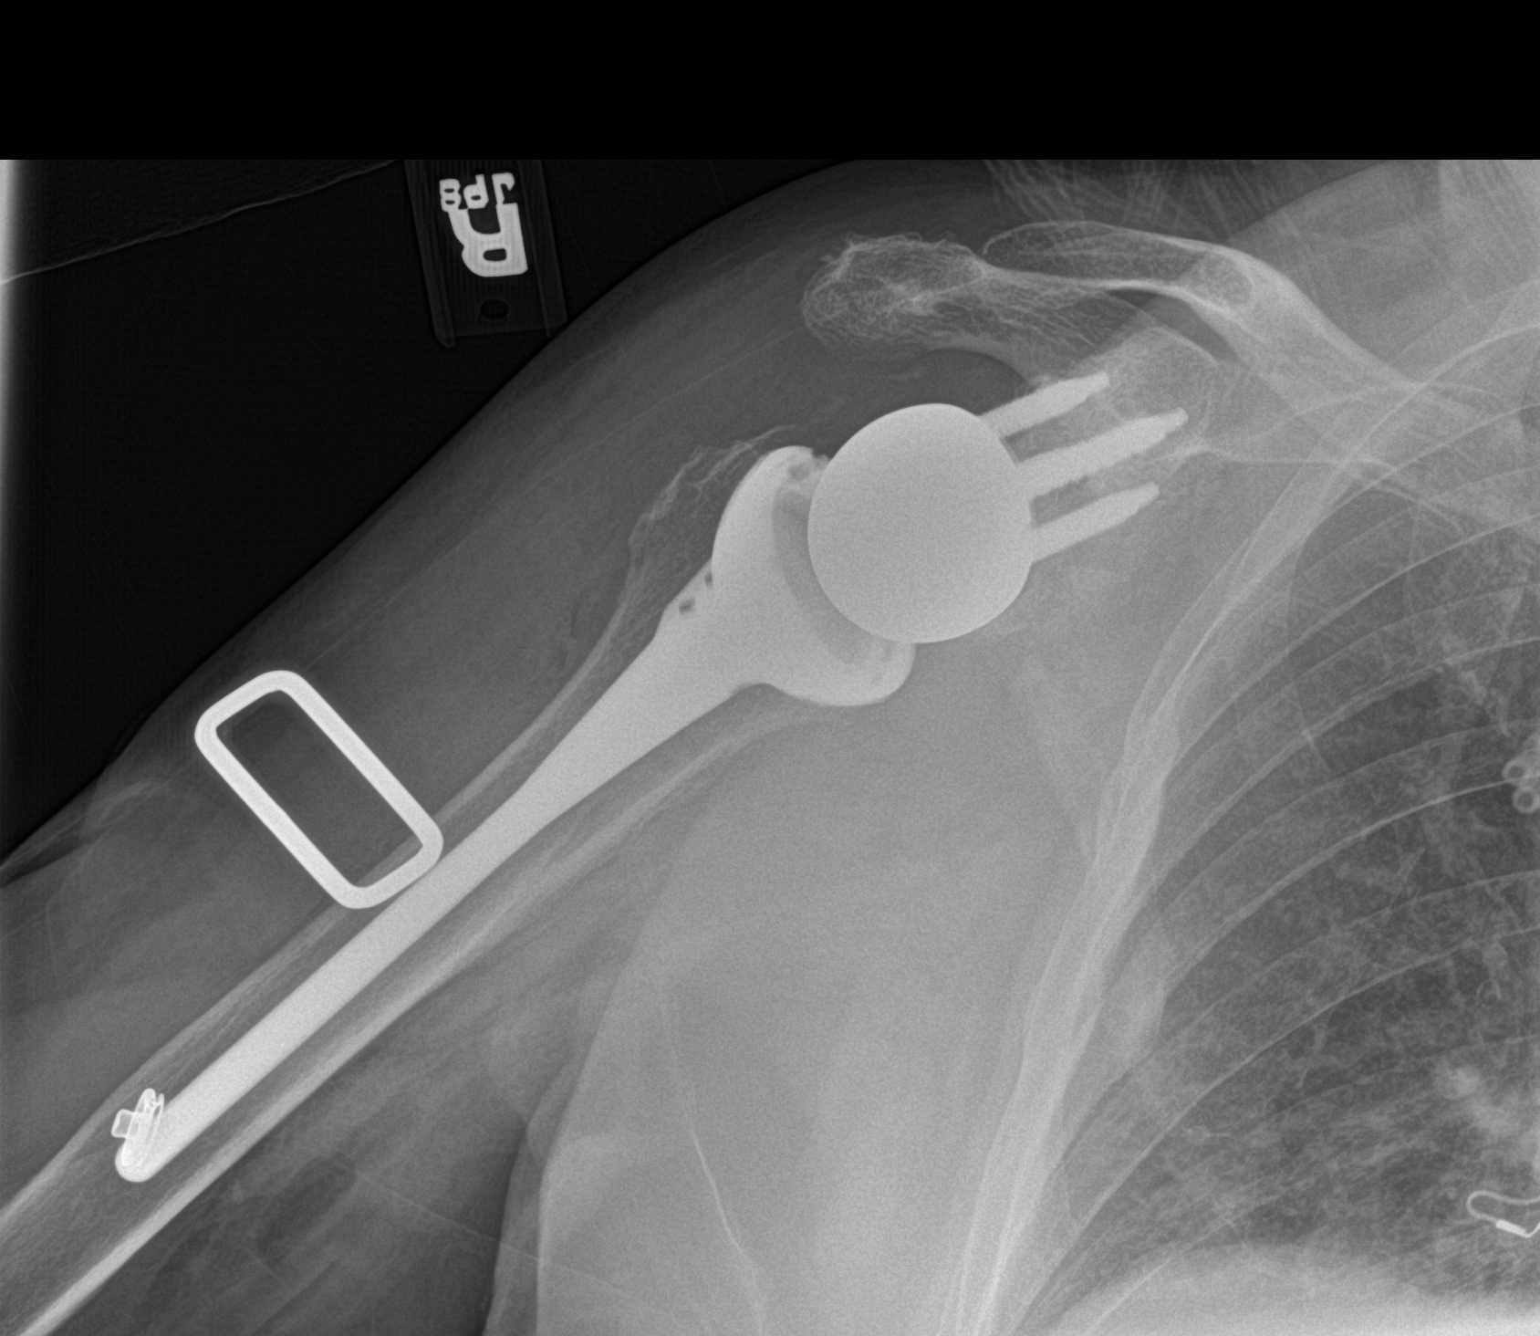

[2 of 2 positions shown; findings below may reference images not displayed]

FINDINGS: Right total shoulder arthroplasty identified. No definite
complicating features.

No dislocation identified.
IMPRESSION: Right total shoulder arthroplasty without definite complicating
features.

## 2018-02-10 ENCOUNTER — Ambulatory Visit: Payer: Medicare Other | Attending: Family Medicine | Admitting: Physical Therapy

## 2018-02-24 DIAGNOSIS — H02054 Trichiasis without entropian left upper eyelid: Secondary | ICD-10-CM | POA: Diagnosis not present

## 2018-03-10 DIAGNOSIS — H02054 Trichiasis without entropian left upper eyelid: Secondary | ICD-10-CM | POA: Diagnosis not present

## 2018-03-17 DIAGNOSIS — M792 Neuralgia and neuritis, unspecified: Secondary | ICD-10-CM | POA: Diagnosis not present

## 2018-04-16 ENCOUNTER — Encounter: Payer: Self-pay | Admitting: Physical Medicine & Rehabilitation

## 2018-04-27 ENCOUNTER — Encounter: Payer: Self-pay | Admitting: Physical Medicine & Rehabilitation

## 2018-04-27 ENCOUNTER — Encounter

## 2018-04-27 ENCOUNTER — Ambulatory Visit
Admission: RE | Admit: 2018-04-27 | Discharge: 2018-04-27 | Disposition: A | Discharge status: Home / Self Care | Payer: Medicare Other | Source: Ambulatory Visit | Attending: Physical Medicine & Rehabilitation | Admitting: Physical Medicine & Rehabilitation

## 2018-04-27 ENCOUNTER — Other Ambulatory Visit: Payer: Self-pay

## 2018-04-27 ENCOUNTER — Ambulatory Visit (HOSPITAL_BASED_OUTPATIENT_CLINIC_OR_DEPARTMENT_OTHER): Payer: Medicare Other | Admitting: Physical Medicine & Rehabilitation

## 2018-04-27 ENCOUNTER — Encounter: Payer: Medicare Other | Attending: Physical Medicine & Rehabilitation

## 2018-04-27 VITALS — BP 112/68 | HR 60 | Ht 59.0 in | Wt 122.6 lb

## 2018-04-27 DIAGNOSIS — M5441 Lumbago with sciatica, right side: Secondary | ICD-10-CM | POA: Diagnosis not present

## 2018-04-27 DIAGNOSIS — Z96611 Presence of right artificial shoulder joint: Secondary | ICD-10-CM | POA: Diagnosis not present

## 2018-04-27 DIAGNOSIS — M47816 Spondylosis without myelopathy or radiculopathy, lumbar region: Secondary | ICD-10-CM | POA: Diagnosis not present

## 2018-04-27 DIAGNOSIS — G8929 Other chronic pain: Secondary | ICD-10-CM | POA: Insufficient documentation

## 2018-04-27 DIAGNOSIS — M79675 Pain in left toe(s): Secondary | ICD-10-CM | POA: Insufficient documentation

## 2018-04-27 DIAGNOSIS — M5442 Lumbago with sciatica, left side: Secondary | ICD-10-CM | POA: Diagnosis not present

## 2018-04-27 DIAGNOSIS — M542 Cervicalgia: Secondary | ICD-10-CM | POA: Diagnosis not present

## 2018-04-27 DIAGNOSIS — Z5181 Encounter for therapeutic drug level monitoring: Secondary | ICD-10-CM | POA: Diagnosis not present

## 2018-04-27 DIAGNOSIS — M4802 Spinal stenosis, cervical region: Secondary | ICD-10-CM | POA: Diagnosis not present

## 2018-04-27 NOTE — Progress Notes (Addendum)
Subjective:    Patient ID: Lindsey Richardson, female    DOB: [birth year], 84 y.o.   MRN: [id number]  HPI 84 year old female with multiple pain complaints including right shoulder, neck, hands, low back, left foot.  The patient has had prior work-up with vascular ultrasound for the left lower extremity.  She does have markedly diminished toe brachial index on the left side.  She has never seen a vascular surgeon.  I discussed with the patient, her daughter, as well as the interpreter that she would need a referral for this  RIght shoulder pain-the patient has complete tear of her rotator cuff and has undergone reverse shoulder replacement by Dr. Tamera Punt.  She states that her pain and mobility did not improve after the surgery.  She has had relief with tramadol in the past taking 100 mg in the morning and 100 mg in the evening  Back pain midline radiates to both knees front and back.  The patient has had no trauma to her low back area she has no numbness or tingling in the feet no bowel or bladder dysfunction.  She has no history of lumbar surgery  She walks without a cane although her daughter states that she thinks she may need to use a cane sometimes  Pt able to dress and bathing but difficult due to poor Right arm elevation, the patient does dress and bathe her self however  Pt with mild neck problems according to her daughter however the patient herself says that her neck does hurt and its hard to move.  Pain in fingers as well.  She has tried Voltaren gel for her finger pain but this has not been very helpful.  Pain Inventory Average Pain 9 Pain Right Now 7 My pain is sharp, stabbing and aching  In the last 24 hours, has pain interfered with the following? General activity 1 Relation with others 1 Enjoyment of life 0 What TIME of day is your pain at its worst? all Sleep (in general) Good  Pain is worse with: walking, bending, sitting, standing and some activites Pain improves  with: medication Relief from Meds: na  Mobility walk with assistance ability to climb steps?  no do you drive?  no needs help with transfers  Function not employed: date last employed . I need assistance with the following:  dressing, bathing and household duties  Neuro/Psych bladder control problems bowel control problems weakness numbness confusion depression anxiety  Prior Studies Any changes since last visit?  no CLINICAL DATA:  Right shoulder pain and weakness for approximately 1 month. No known injury.  EXAM: MRI OF THE RIGHT SHOULDER WITHOUT CONTRAST  TECHNIQUE: Multiplanar, multisequence MR imaging of the shoulder was performed. No intravenous contrast was administered.  COMPARISON:  None.  FINDINGS: Rotator cuff: The supraspinatus, infraspinatus and subscapularis tendons are all completely torn. The supraspinatus and infraspinatus retracted medial to the glenoid, 5-6 cm. There is 2-3 cm of subscapularis retraction. Teres minor is intact.  Muscles: Severe fatty atrophy of the supraspinatus and infraspinatus is identified. The subscapularis and teres minor appear normal.  Biceps long head: The tendon is completely torn from the superior labrum and retracted approximately 4 cm below the top of the humeral head.  Acromioclavicular Joint: Moderate degenerative change is seen. Type 2 acromion. Subacromial spurring is present. There is fluid in the subacromial/subdeltoid bursa.  Glenohumeral Joint: The humeral head is high-riding and abuts the undersurface of the acromion.  Labrum:  Superior labrum is degenerated.  No discrete  tear.  Bones:  No fracture or worrisome lesion.  Other: None.  IMPRESSION: Complete supraspinatus, infraspinatus and subscapularis tendon tears. The supraspinatus and infraspinatus are retracted 5-6 cm with marked atrophy of their muscle bellies. 2-3 cm of subscapularis retraction without atrophy is  identified.  Complete tear of the long head of biceps from the superior labrum.  Moderate acromioclavicular osteoarthritis.  Subacromial/subdeltoid bursitis.   Electronically Signed   By: Inge Rise M.D.   On: 07/27/2016 14:56 Physicians involved in your care Any changes since last visit?  no Primary care [name]   No family history on file. Social History   Socioeconomic History  . Marital status: Widowed    Spouse name: Not on file  . Number of children: Not on file  . Years of education: Not on file  . Highest education level: Not on file  Occupational History  . Not on file  Social Needs  . Financial resource strain: Not on file  . Food insecurity:    Worry: Not on file    Inability: Not on file  . Transportation needs:    Medical: Not on file    Non-medical: Not on file  Tobacco Use  . Smoking status: Never Smoker  . Smokeless tobacco: Never Used  Substance and Sexual Activity  . Alcohol use: No  . Drug use: No  . Sexual activity: Not on file  Lifestyle  . Physical activity:    Days per week: Not on file    Minutes per session: Not on file  . Stress: Not on file  Relationships  . Social connections:    Talks on phone: Not on file    Gets together: Not on file    Attends religious service: Not on file    Active member of club or organization: Not on file    Attends meetings of clubs or organizations: Not on file    Relationship status: Not on file  Other Topics Concern  . Not on file  Social History Narrative  . Not on file   Past Surgical History:  Procedure Laterality Date  . HERNIA REPAIR    . REVERSE SHOULDER ARTHROPLASTY Right 01/01/2017  . REVERSE SHOULDER ARTHROPLASTY Right 01/01/2017   Procedure: REVERSE SHOULDER ARTHROPLASTY;  Surgeon: Tania Ade, MD;  Location: [facility];  Service: Orthopedics;  Laterality: Right;  Right reverse total shoulder arthroplasty   Past Medical History:  Diagnosis Date  . Arthritis   .  GERD (gastroesophageal reflux disease)    pt denies  . Hyperlipidemia   . Migraine   . Osteopenia   . Osteoporosis   . Vitamin D deficiency    BP 112/68   Pulse 60   Ht 4\' 11"  (1.499 m)   Wt 122 lb 9.6 oz (55.6 kg)   SpO2 97%   BMI 24.76 kg/m   Opioid Risk Score:   Fall Risk Score:  `1  Depression screen PHQ 2/9  Depression screen PHQ 2/9 04/27/2018  Decreased Interest 1  Down, Depressed, Hopeless 1  PHQ - 2 Score 2  Altered sleeping 1  Tired, decreased energy 2  Change in appetite 1  Feeling bad or failure about yourself  3  Trouble concentrating 2  Moving slowly or fidgety/restless 2  Suicidal thoughts 0  PHQ-9 Score 13  Difficult doing work/chores Very difficult    Review of Systems  Constitutional: Negative.   HENT: Negative.   Eyes: Negative.   Respiratory: Negative.   Cardiovascular: Negative.   Gastrointestinal:  Positive for constipation.  Endocrine: Negative.   Genitourinary:       Bladder control  Musculoskeletal: Positive for arthralgias and myalgias.  Skin: Negative.   Allergic/Immunologic: Negative.   Neurological: Positive for numbness.  Hematological: Negative.   Psychiatric/Behavioral: Positive for confusion and dysphoric mood. The patient is nervous/anxious.   All other systems reviewed and are negative.      Objective:   Physical Exam Vitals signs and nursing note reviewed.  Constitutional:      Appearance: Normal appearance.  HENT:     Head: Normocephalic.     Nose: No congestion or rhinorrhea.     Mouth/Throat:     Mouth: Mucous membranes are moist.  Eyes:     General: No scleral icterus.       Right eye: No discharge.        Left eye: No discharge.  Neck:     Musculoskeletal: Normal range of motion.  Cardiovascular:     Rate and Rhythm: Normal rate and regular rhythm.     Pulses: Normal pulses.     Heart sounds: No murmur.  Pulmonary:     Effort: Pulmonary effort is normal. No respiratory distress.     Breath sounds:  Normal breath sounds. No stridor.  Abdominal:     General: Abdomen is flat. Bowel sounds are normal. There is no distension.     Palpations: Abdomen is soft.  Musculoskeletal:     Comments: Right biceps retraction  Skin:    General: Skin is warm.     Comments: Left small toes are cool but no evidence of skin lesions.  There is mild swelling of the left foot.  No pain with range of motion.  Neurological:     General: No focal deficit present.     Mental Status: She is alert and oriented to person, place, and time.     Sensory: No sensory deficit.     Motor: No tremor or abnormal muscle tone.     Coordination: Coordination normal.     Gait: Gait normal.     Deep Tendon Reflexes:     Reflex Scores:      Tricep reflexes are 0 on the right side and 2+ on the left side.      Bicep reflexes are 0 on the right side and 2+ on the left side.      Brachioradialis reflexes are 0 on the right side and 2+ on the left side.      Patellar reflexes are 2+ on the right side and 2+ on the left side.      Achilles reflexes are 0 on the right side and 0 on the left side. Psychiatric:        Mood and Affect: Mood normal.        Behavior: Behavior normal.     Pain with cervical ext and lateral bending  There is retraction of the biceps on the right side. Motor strength is 3- at the right deltoid limited by range of motion and weakness. 3 at the bicep 5 at the tricep 5 at the finger flexors and extensors on the right 5/5 in left deltoid bicep tricep grip Lower extremity strength is 5/5 bilateral hip flexor knee extensor ankle dorsiflexor Ambulates without assistive device no evidence of toe drag or knee instability Low back is mild tenderness palpation lumbosacral junction no tenderness over the PSIS she has pain with forward flexion but has good range of motion her extension of the lumbar  spine is limited and is accompanied by pain Her cervical spine range of motion is decreased with flexion extension  lateral bending and rotation has approximately 50% of normal range of motion     Assessment & Plan:  1.  Chronic multifocal pain  Shoulder pain post shoulder replacement with history of complete tears of her rotator cuff as well as long head of the biceps resulting in some retraction of the bicep muscle.  She also has acromioclavicular joint osteoarthritis that also may be a pain generator. We discussed that she may respond to shoulder injection specifically this would refer to ultrasound-guided suprascapular nerve block plus minus acromioclavicular joint injection under ultrasound guidance  Low back pain given her age this is  lumbar spondylosis with possible lumbar spinal stenosis.  She does not have any clear-cut radicular signs although she does have some sciatic type discomfort radiating to her posterior knees.  We will check some x-rays and consider some physical therapy  Cervical pain likely a combination of degenerative disc disease as well as lumbar spondylosis without myelopathy.  Does not have any radicular symptoms.  Will check x-rays to see if she has any degenerative spondylolisthesis  Hand pain she does have Heberden's nodules likely osteoarthritis of fingers that has not responded to Voltaren gel.  She has had good results with tramadol in the past taking 50 mg 2 in the morning and at times 1-2 in the evening depending on her pain levels.  We will check urine drug screen and start tramadol 50 mg 2 in the morning and 1 in the evening total of 3 tablets/day as needed  She may benefit from outpatient physical therapy for her neck pain back pain and perhaps her shoulder issues as well  There is evidence of nonprescribed opiate, codeine and metabolites, on urine drug screen therefore we will not prescribe tramadol or other opiates for this patient.

## 2018-04-28 ENCOUNTER — Telehealth: Payer: Self-pay

## 2018-04-28 NOTE — Telephone Encounter (Signed)
Patient daughter called stating that Tramadol has not been called in or sent in. In viewing note Tramadol will be sent in after UDS is all clear.  I called daughter and let her know we were waiting on results of urine before going further with Tramadol.

## 2018-04-29 ENCOUNTER — Other Ambulatory Visit: Payer: Self-pay

## 2018-04-29 DIAGNOSIS — M79675 Pain in left toe(s): Secondary | ICD-10-CM

## 2018-04-30 ENCOUNTER — Ambulatory Visit (INDEPENDENT_AMBULATORY_CARE_PROVIDER_SITE_OTHER): Payer: Medicare Other | Admitting: Vascular Surgery

## 2018-04-30 ENCOUNTER — Encounter: Payer: Self-pay | Admitting: Vascular Surgery

## 2018-04-30 ENCOUNTER — Ambulatory Visit (HOSPITAL_COMMUNITY)
Admission: RE | Admit: 2018-04-30 | Discharge: 2018-04-30 | Disposition: A | Discharge status: Home / Self Care | Payer: Medicare Other | Source: Ambulatory Visit | Attending: Vascular Surgery | Admitting: Vascular Surgery

## 2018-04-30 ENCOUNTER — Other Ambulatory Visit: Payer: Self-pay

## 2018-04-30 VITALS — BP 101/58 | HR 57 | Resp 18 | Ht 59.0 in | Wt 133.2 lb

## 2018-04-30 DIAGNOSIS — M79675 Pain in left toe(s): Secondary | ICD-10-CM | POA: Insufficient documentation

## 2018-04-30 DIAGNOSIS — I7300 Raynaud's syndrome without gangrene: Secondary | ICD-10-CM | POA: Diagnosis not present

## 2018-04-30 NOTE — Progress Notes (Signed)
Patient ID: Staphanie Harbison, female   DOB: Jan 01, 1935, 84 y.o.   MRN: [id number]  Reason for Consult: New Patient (Initial Visit) (eval chronic bil foot/toe pain )   Referred by [name], MD  Subjective:     HPI:  Melondy Blanchard is a 84 y.o. female presents for left greater than right toe pain.  She states that this is been present for 2 months although her son states that is probably longer.  She is from [place] previously lived in [place] and has been here for 3 years.  States that has had some discomfort since moving to [place].  Does not notice any change with temperature.  Does have some cracking of the skin on the left foot.  Does not have any hand involvement.  Is able to walk without limitation.  No tissue loss or ulceration.  Does not know of any previous vascular issues.  She is a lifelong non-smoker.  Past Medical History:  Diagnosis Date  . Arthritis   . GERD (gastroesophageal reflux disease)    pt denies  . Hyperlipidemia   . Migraine   . Osteopenia   . Osteoporosis   . Vitamin D deficiency    History reviewed. No pertinent family history. Past Surgical History:  Procedure Laterality Date  . HERNIA REPAIR    . REVERSE SHOULDER ARTHROPLASTY Right 01/01/2017  . REVERSE SHOULDER ARTHROPLASTY Right 01/01/2017   Procedure: REVERSE SHOULDER ARTHROPLASTY;  Surgeon: Tania Ade, MD;  Location: [facility];  Service: Orthopedics;  Laterality: Right;  Right reverse total shoulder arthroplasty    Short Social History:  Social History   Tobacco Use  . Smoking status: Never Smoker  . Smokeless tobacco: Never Used  Substance Use Topics  . Alcohol use: No    Allergies  Allergen Reactions  . Pork-Derived Products Other (See Comments)    Religious preference    Current Outpatient Medications  Medication Sig Dispense Refill  . Adalimumab (HUMIRA PEN Delia) Inject into the skin.    . fluticasone (FLONASE) 50 MCG/ACT nasal spray Place 2 sprays into both nostrils  daily.  0   No current facility-administered medications for this visit.     Review of Systems  Constitutional:  Constitutional negative. HENT: HENT negative.  Eyes: Eyes negative.  Respiratory: Respiratory negative.  Cardiovascular: Cardiovascular negative.  GI: Gastrointestinal negative.  Musculoskeletal:       Toe pain left greater than right Skin: Skin negative.  Neurological: Neurological negative. Hematologic: Hematologic/lymphatic negative.  Psychiatric: Psychiatric negative.        Objective:  Objective   There were no vitals filed for this visit. There is no height or weight on file to calculate BMI.  Physical Exam HENT:     Head: Normocephalic.  Eyes:     Pupils: Pupils are equal, round, and reactive to light.  Cardiovascular:     Rate and Rhythm: Normal rate.     Pulses:          Radial pulses are 2+ on the right side and 2+ on the left side.       Popliteal pulses are 2+ on the right side and 2+ on the left side.       Dorsalis pedis pulses are 2+ on the right side and 2+ on the left side.  Pulmonary:     Effort: Pulmonary effort is normal.  Musculoskeletal:        General: No swelling.     Left lower leg: No edema.  Skin:  General: Skin is warm and dry.     Capillary Refill: Capillary refill takes less than 2 seconds.     Comments: Bluish and reddish discoloration to left first through third toes and right side second through fourth toes  Neurological:     General: No focal deficit present.     Mental Status: She is alert.  Psychiatric:        Mood and Affect: Mood normal.        Behavior: Behavior normal.        Thought Content: Thought content normal.        Judgment: Judgment normal.     Data: I have independently interpreted her bilateral ABIs which are greater than 1 and triphasic bilaterally with toe pressure on the right 143 and left 69     Assessment/Plan:     84 year old female presents for left greater than right toe pain with  discoloration.  ABIs and toe pressures are normal and she has palpable distal pulses.  This is most consistent with raynauds phenomena particularly happening since moving [place] from [place].  I have recommended conservative therapy particularly with keeping her feet warm in the winter months and also moisturized to prevent cracking of the skin.  If her symptoms become bad enough she could be evaluated by rheumatology for consideration of calcium channel blockers either topical or p.o.  She can see me on an as-needed basis.     Waynetta Sandy MD [facility]

## 2018-05-02 LAB — TOXASSURE SELECT 13 (MW), URINE

## 2018-05-04 ENCOUNTER — Telehealth: Payer: Self-pay | Admitting: *Deleted

## 2018-05-04 ENCOUNTER — Ambulatory Visit: Payer: Self-pay | Admitting: Physical Medicine & Rehabilitation

## 2018-05-04 NOTE — Telephone Encounter (Signed)
Urine drug screen is positive for Codeine and its metabolite norcodeine. According to the PMP aware reporting this was not a prescribed medication and was not reported as taken. Therefore according to Dr Letta Pate, "we will not be prescribing tramadol or other opiates".

## 2018-05-06 ENCOUNTER — Encounter: Payer: Self-pay | Admitting: *Deleted

## 2018-05-06 DIAGNOSIS — R350 Frequency of micturition: Secondary | ICD-10-CM | POA: Diagnosis not present

## 2018-05-06 DIAGNOSIS — R351 Nocturia: Secondary | ICD-10-CM | POA: Diagnosis not present

## 2018-05-06 DIAGNOSIS — N3941 Urge incontinence: Secondary | ICD-10-CM | POA: Diagnosis not present

## 2018-05-06 NOTE — Telephone Encounter (Signed)
Does she have any medical records for this, copy of Xray etc?

## 2018-05-06 NOTE — Telephone Encounter (Signed)
I reached out to patient's liason, Lindsey Richardson and explained that Dr. Letta Pate needs some proof that this is indeed true.  She said this would be very difficult because it is [place] after all.  She asked if it might be possible for [place] to order a x-ray if they cannot secure documentation from the physicians in [place]

## 2018-05-06 NOTE — Telephone Encounter (Signed)
Lindsey Richardson called on the patients behalf to inform that the patient failed to mention that she had gone back to [place] 6 months ago. While she was there, she  Broke her foot and was prescribed codeine.  She explains that the patient was taking it only when her pain spiked. She states that is why her urine drug screen came back with codeine in it.  She states that the patient is not getting meds from anybody else.  She is asking for reconsideration for pain medication(tramadol).

## 2018-05-07 NOTE — Telephone Encounter (Signed)
Patients daughter, Vanice Sarah called back and indicated that she has reached out to the physicians back in [place] and they will be sending documentation that indicates patient was prescribed medication containing codeine

## 2018-05-10 ENCOUNTER — Telehealth: Payer: Self-pay

## 2018-05-10 DIAGNOSIS — H1013 Acute atopic conjunctivitis, bilateral: Secondary | ICD-10-CM | POA: Diagnosis not present

## 2018-05-10 DIAGNOSIS — H02054 Trichiasis without entropian left upper eyelid: Secondary | ICD-10-CM | POA: Diagnosis not present

## 2018-05-10 MED ORDER — TRAMADOL HCL 50 MG PO TABS: 50.0000 mg | ORAL_TABLET | Freq: Two times a day (BID) | ORAL | 0 refills | Status: DC | PRN

## 2018-05-10 NOTE — Telephone Encounter (Signed)
After reviewing paper brought in showing she was previously prescribed something with codiene in it, Dr Letta Pate has agreed to prescribe Tramadol 50 mg bid prn #60, no rf. She has return appt 05/21/18.  Called to the pharmacy and [facility] notified.

## 2018-05-10 NOTE — Telephone Encounter (Signed)
Pt daughter called to see if medical records came on pt and if meds will be called pt is leaving for [place] need ASAP.  The medical records are in and in your to be signed folder.

## 2018-05-11 ENCOUNTER — Telehealth: Payer: Self-pay | Admitting: *Deleted

## 2018-05-11 NOTE — Telephone Encounter (Signed)
Prior Authorization submitted to [facility]

## 2018-05-21 ENCOUNTER — Encounter: Payer: Medicare Other | Attending: Physical Medicine & Rehabilitation

## 2018-05-21 ENCOUNTER — Encounter: Payer: Self-pay | Admitting: Physical Medicine & Rehabilitation

## 2018-05-21 ENCOUNTER — Ambulatory Visit (HOSPITAL_BASED_OUTPATIENT_CLINIC_OR_DEPARTMENT_OTHER): Payer: Medicare Other | Admitting: Physical Medicine & Rehabilitation

## 2018-05-21 VITALS — BP 126/68 | HR 70 | Resp 14 | Ht 59.0 in | Wt 133.0 lb

## 2018-05-21 DIAGNOSIS — Z96611 Presence of right artificial shoulder joint: Secondary | ICD-10-CM | POA: Diagnosis not present

## 2018-05-21 DIAGNOSIS — M5030 Other cervical disc degeneration, unspecified cervical region: Secondary | ICD-10-CM | POA: Diagnosis not present

## 2018-05-21 DIAGNOSIS — M4716 Other spondylosis with myelopathy, lumbar region: Secondary | ICD-10-CM | POA: Diagnosis not present

## 2018-05-21 DIAGNOSIS — M79675 Pain in left toe(s): Secondary | ICD-10-CM | POA: Diagnosis not present

## 2018-05-21 DIAGNOSIS — Z5181 Encounter for therapeutic drug level monitoring: Secondary | ICD-10-CM | POA: Insufficient documentation

## 2018-05-21 DIAGNOSIS — M5441 Lumbago with sciatica, right side: Secondary | ICD-10-CM | POA: Diagnosis not present

## 2018-05-21 DIAGNOSIS — M5442 Lumbago with sciatica, left side: Secondary | ICD-10-CM | POA: Diagnosis not present

## 2018-05-21 DIAGNOSIS — G8929 Other chronic pain: Secondary | ICD-10-CM | POA: Insufficient documentation

## 2018-05-21 DIAGNOSIS — M542 Cervicalgia: Secondary | ICD-10-CM | POA: Insufficient documentation

## 2018-05-21 MED ORDER — TRAMADOL HCL 50 MG PO TABS: 100.0000 mg | ORAL_TABLET | Freq: Two times a day (BID) | ORAL | 2 refills | Status: DC

## 2018-05-21 MED ORDER — TRAMADOL HCL 50 MG PO TABS: 100.0000 mg | ORAL_TABLET | Freq: Two times a day (BID) | ORAL | 0 refills | Status: DC

## 2018-05-21 NOTE — Progress Notes (Signed)
Subjective:    Patient ID: Lindsey Richardson, female    DOB: Jun 29, 1934, 84 y.o.   MRN: [id number]  HPI  CC:  Back pain , Neck pain, Left toe pain No new issues, reviewed [facility] note with pt and daughter and interpreter.  Pt diagnosed with Raynaud's syndrome, CCB was recommended.  Referral to Rheum was suggested.  Pt has not followed up with PCP on this  In terms of low back , we reviewed xrays which demonstrate oasteoarthritis of lumbar facets, primarily at L4-5 and L5-S1  ROS- neg for progressive LE weakness or numbness No bowel or bladder dysfunction   Pain Inventory Average Pain 8 Pain Right Now 5 My pain is aching  In the last 24 hours, has pain interfered with the following? General activity 2 Relation with others 2 Enjoyment of life 2 What TIME of day is your pain at its worst? daytime Sleep (in general) Fair  Pain is worse with: walking, bending and sitting Pain improves with: medication Relief from Meds: 8  Mobility walk with assistance  Function retired  Neuro/Psych trouble walking  Prior Studies Any changes since last visit?  no  Physicians involved in your care Any changes since last visit?  no   History reviewed. No pertinent family history. Social History   Socioeconomic History  . Marital status: Widowed    Spouse name: Not on file  . Number of children: Not on file  . Years of education: Not on file  . Highest education level: Not on file  Occupational History  . Not on file  Social Needs  . Financial resource strain: Not on file  . Food insecurity:    Worry: Not on file    Inability: Not on file  . Transportation needs:    Medical: Not on file    Non-medical: Not on file  Tobacco Use  . Smoking status: Never Smoker  . Smokeless tobacco: Never Used  Substance and Sexual Activity  . Alcohol use: No  . Drug use: No  . Sexual activity: Not on file  Lifestyle  . Physical activity:    Days per week: Not on file    Minutes per session:  Not on file  . Stress: Not on file  Relationships  . Social connections:    Talks on phone: Not on file    Gets together: Not on file    Attends religious service: Not on file    Active member of club or organization: Not on file    Attends meetings of clubs or organizations: Not on file    Relationship status: Not on file  Other Topics Concern  . Not on file  Social History Narrative  . Not on file   Past Surgical History:  Procedure Laterality Date  . HERNIA REPAIR    . REVERSE SHOULDER ARTHROPLASTY Right 01/01/2017  . REVERSE SHOULDER ARTHROPLASTY Right 01/01/2017   Procedure: REVERSE SHOULDER ARTHROPLASTY;  Surgeon: Tania Ade, MD;  Location: [facility];  Service: Orthopedics;  Laterality: Right;  Right reverse total shoulder arthroplasty   Past Medical History:  Diagnosis Date  . Arthritis   . GERD (gastroesophageal reflux disease)    pt denies  . Hyperlipidemia   . Migraine   . Osteopenia   . Osteoporosis   . Vitamin D deficiency    BP 126/68   Pulse 70   Resp 14   Ht 4\' 11"  (1.499 m)   Wt 133 lb (60.3 kg)   SpO2 95%  BMI 26.86 kg/m   Opioid Risk Score:   Fall Risk Score:  `1  Depression screen PHQ 2/9  Depression screen PHQ 2/9 04/27/2018  Decreased Interest 1  Down, Depressed, Hopeless 1  PHQ - 2 Score 2  Altered sleeping 1  Tired, decreased energy 2  Change in appetite 1  Feeling bad or failure about yourself  3  Trouble concentrating 2  Moving slowly or fidgety/restless 2  Suicidal thoughts 0  PHQ-9 Score 13  Difficult doing work/chores Very difficult    Review of Systems  Constitutional: Negative.   HENT: Negative.   Eyes: Negative.   Respiratory: Negative.   Cardiovascular: Negative.   Gastrointestinal: Negative.   Endocrine: Negative.   Genitourinary: Negative.   Musculoskeletal: Positive for arthralgias, back pain and gait problem.  Skin: Negative.   Allergic/Immunologic: Negative.   Hematological: Negative.   All other systems  reviewed and are negative.      Objective:   Physical Exam  Elderly female no acute distress Cervical spine range of motion reduced to 25% with flexion extension lateral bending and rotation Lumbar range of motion 25% of extension, 50% lateral bending and 75% flexion Negative straight leg raising Lower extremity strength is 5/5 bilateral hip flexor knee extension ankle dorsiflexion 5/5 bilateral deltoid bicep tricep grip she has full range of motion of the shoulders Left great toe is cool but no evidence of cyanosis.  There is a callus at the plantar surface of the first MTP as well as the medial aspect of the second toe No skin breakdown Ambulates without assistive device Mood and affect appropriate       Assessment & Plan:  1.  Chronic multifactorial pain, low back, cervical, shoulder, has had relief with tramadol, codeine and metabolites seen in urine, has medical record from [place] indicating that this was prescribed for an ankle fracture last summer and the patient states that she had some medication left over. Low back pain given her age this is  lumbar spondylosis with possible lumbar spinal stenosis.  She does not have any clear-cut radicular signs although she does have some sciatic type discomfort radiating to her posterior knees.  X-rays show lumbar spondylosis primarily L4-5 and L5-S1 We discussed medial branch blocks which the patient does not wish to pursue at the current time She is going to [place] in a couple weeks for 3 months therefore will not start with any therapy at this time.    Cervical pain likely a combination of degenerative disc disease as well as cervical spondylosis without myelopathy.  Does not have any radicular symptoms.  No x-ray evidence of spondylolisthesis  Will rx tramadol 100mg  BID UDS next visit NP visit in 3 mo

## 2018-05-24 DIAGNOSIS — N3941 Urge incontinence: Secondary | ICD-10-CM | POA: Diagnosis not present

## 2018-05-24 DIAGNOSIS — R351 Nocturia: Secondary | ICD-10-CM | POA: Diagnosis not present

## 2018-05-24 DIAGNOSIS — R350 Frequency of micturition: Secondary | ICD-10-CM | POA: Diagnosis not present

## 2018-05-25 ENCOUNTER — Ambulatory Visit: Payer: Self-pay | Admitting: Physical Medicine & Rehabilitation

## 2018-09-08 ENCOUNTER — Encounter: Payer: Medicare Other | Attending: Registered Nurse | Admitting: Registered Nurse

## 2018-09-23 DIAGNOSIS — R05 Cough: Secondary | ICD-10-CM | POA: Diagnosis not present

## 2018-09-23 DIAGNOSIS — Z6833 Body mass index (BMI) 33.0-33.9, adult: Secondary | ICD-10-CM | POA: Diagnosis not present

## 2018-09-24 DIAGNOSIS — R05 Cough: Secondary | ICD-10-CM | POA: Diagnosis not present

## 2018-10-06 DIAGNOSIS — M0579 Rheumatoid arthritis with rheumatoid factor of multiple sites without organ or systems involvement: Secondary | ICD-10-CM | POA: Diagnosis not present

## 2018-10-06 DIAGNOSIS — M150 Primary generalized (osteo)arthritis: Secondary | ICD-10-CM | POA: Diagnosis not present

## 2018-10-06 DIAGNOSIS — M7989 Other specified soft tissue disorders: Secondary | ICD-10-CM | POA: Diagnosis not present

## 2018-10-06 DIAGNOSIS — M0589 Other rheumatoid arthritis with rheumatoid factor of multiple sites: Secondary | ICD-10-CM | POA: Diagnosis not present

## 2018-10-06 DIAGNOSIS — M797 Fibromyalgia: Secondary | ICD-10-CM | POA: Diagnosis not present

## 2018-10-06 DIAGNOSIS — M79643 Pain in unspecified hand: Secondary | ICD-10-CM | POA: Diagnosis not present

## 2018-10-06 DIAGNOSIS — M159 Polyosteoarthritis, unspecified: Secondary | ICD-10-CM | POA: Diagnosis not present

## 2018-10-06 DIAGNOSIS — Z79899 Other long term (current) drug therapy: Secondary | ICD-10-CM | POA: Diagnosis not present

## 2018-10-12 ENCOUNTER — Telehealth: Payer: Self-pay | Admitting: *Deleted

## 2018-10-12 MED ORDER — TRAMADOL HCL 50 MG PO TABS: 100.0000 mg | ORAL_TABLET | Freq: Two times a day (BID) | ORAL | 0 refills | Status: DC

## 2018-10-12 NOTE — Telephone Encounter (Signed)
Left message for [facility].

## 2018-10-12 NOTE — Telephone Encounter (Signed)
Lindsey Richardson called and says they called several times last week about a refill on her tramadol, (last filled PMP 05/25/18).  She did not come to her 09/08/18 appt with Zella Ball.  Do you want to refill this?

## 2018-10-12 NOTE — Telephone Encounter (Signed)
Ordered 1 mo supply, needs to see Zella Ball next mo

## 2018-10-27 DIAGNOSIS — H02009 Unspecified entropion of unspecified eye, unspecified eyelid: Secondary | ICD-10-CM | POA: Diagnosis not present

## 2018-11-03 DIAGNOSIS — H1010 Acute atopic conjunctivitis, unspecified eye: Secondary | ICD-10-CM | POA: Diagnosis not present

## 2018-11-04 DIAGNOSIS — Z1159 Encounter for screening for other viral diseases: Secondary | ICD-10-CM | POA: Diagnosis not present

## 2018-11-08 ENCOUNTER — Encounter: Payer: Self-pay | Admitting: Registered Nurse

## 2018-11-08 ENCOUNTER — Encounter: Payer: Medicare Other | Attending: Registered Nurse | Admitting: Registered Nurse

## 2018-11-08 ENCOUNTER — Other Ambulatory Visit: Payer: Self-pay

## 2018-11-08 VITALS — BP 113/69 | HR 80

## 2018-11-08 DIAGNOSIS — Z96611 Presence of right artificial shoulder joint: Secondary | ICD-10-CM

## 2018-11-08 DIAGNOSIS — M47816 Spondylosis without myelopathy or radiculopathy, lumbar region: Secondary | ICD-10-CM | POA: Diagnosis not present

## 2018-11-08 DIAGNOSIS — Z760 Encounter for issue of repeat prescription: Secondary | ICD-10-CM | POA: Insufficient documentation

## 2018-11-08 DIAGNOSIS — G8929 Other chronic pain: Secondary | ICD-10-CM | POA: Insufficient documentation

## 2018-11-08 DIAGNOSIS — M4716 Other spondylosis with myelopathy, lumbar region: Secondary | ICD-10-CM | POA: Diagnosis not present

## 2018-11-08 NOTE — Progress Notes (Signed)
Subjective:    Patient ID: Lindsey Richardson, female    DOB: 1934-12-16, 84 y.o.   MRN: [id number]  HPI Form for health and history was completed by patients daughter whom is also a patient at this practice. The daughter states that the mother is in a lot of pain but has been traveling and experiencing "personal issues" that she is not comfortable sharing in front of an interpreter. Her daughter also states her mother is having trouble understanding and communicating and does not feel she is competent to attend an appointment with out her daughter to speak for her. The patient stated she is in no pain, and has not taken the medication since 05/2018. Form completed by daughter not abstracted due to discrepancies and the conflict of interest with the daughter and her husband being a patient as well.  Courtney/CMA  Lindsey Richardson is a 84 y.o. female who returns for follow up appointment for chronic pain and medication refill. Interpreter in room Ta[place], daughter was in waiting room at the time of the assessment. Lindsey Richardson was asked three different times during her assessment via interpreter if she was in pain, she even spoke in [place] and stated "no pain, while  tapping all over her body".  This provider spoke with daughter [name] regarding the above, her mother stated she was in no pain, also she hasn't been prescribed Tramadol since February. [name] stated her mother was in [place]. The daughter spoke with Lindsey Richardson, afterwards, Lindsey Richardson bowed her head and stated she was having pain in her lower back and bilateral lower extremities. This provider asked Lindsey Richardson if she was in pain via interpreter, she began to look at her daughter, this provider asked if she would look at the provider and I stood in front of her daughter [name]. Only when daughter was around she stated she was in pain. Due to the discrepancies, I let Ms. [name] know I will speak with Dr. Letta Pate regarding the above, no prescription  was given during this visit.  [name] asked if she could speak to me, she stated she believes her mother has dementia, she was instructed to obtain a referral from her PCP for a neurology consult. [name] was very upset with this provider not giving her mother the tramadol prescription, tried to explain to her the discrepancies in the story and would prefer to discussed the above with Dr. Letta Pate. Also explained Dr. Letta Pate was on vacation and the above will be discussed when he returns she verbalizes understanding.   Taous ( the interpreter) asked the provider if she did anything wrong, she was reassured she did nothing wrong, she translated Lindsey Richardson statements. The above was also discussed with the  manager Zorita Pang.      Objective:   Physical Exam Vitals signs and nursing note reviewed.  Constitutional:      Appearance: Normal appearance.  Neck:     Musculoskeletal: Normal range of motion and neck supple.  Cardiovascular:     Rate and Rhythm: Normal rate and regular rhythm.     Pulses: Normal pulses.     Heart sounds: Normal heart sounds.  Pulmonary:     Effort: Pulmonary effort is normal.     Breath sounds: Normal breath sounds.  Musculoskeletal:     Comments: Normal Muscle Bulk and Muscle Testing Reveals:  Upper Extremities: Right Decreased ROM 35 Degrees and Miscle Strength 5/5  Left: Full ROM and Muscle Strength 5/5 Lower Extremities: Full ROM and Muscle Strength  5/5 Arises from Table with ease Narrow Based Gait   Skin:    General: Skin is warm and dry.  Neurological:     Mental Status: She is alert and oriented to person, place, and time.  Psychiatric:        Mood and Affect: Mood normal.        Behavior: Behavior normal.           Assessment & Plan:  1. S/P Reverse Total Shoulder arthroplasty: S/P 01/01/2017. Continue HEP as Tolerated. Continue to Monitor.  2.  Lumbar Spondylosis: Continue HEP as Tolerated. Lindsey Richardson denies any pain at this time.   30  minutes of face to face patient care time was spent during this visit. All questions were encouraged and answered.  F/U in 6 months.

## 2018-11-10 DIAGNOSIS — Z1159 Encounter for screening for other viral diseases: Secondary | ICD-10-CM | POA: Diagnosis not present

## 2018-11-10 DIAGNOSIS — J189 Pneumonia, unspecified organism: Secondary | ICD-10-CM | POA: Diagnosis not present

## 2018-11-11 ENCOUNTER — Telehealth: Payer: Self-pay

## 2018-11-11 NOTE — Telephone Encounter (Signed)
Daughter [name] [phone] called office and stated to [facility] and then asked that I call back that her mom is in pain and because she was not allowed to be in room yesterday mother has dementia and didn't tell NP.  She said NP was rude because she told her patient advised 3 x's not in pain she will not prescribe because daughter was upset she advised she would confirm with MD - but if patient states 3 x's not in pain- she will not dispense RX for tramadol.   I was involved with this patient being asked to leave the room due to provider preference to have interpreter not family due to covid (social distancing) in small clinic room. [name] stated she said her mother told her she was afraid to tell MD she is in pain that they would put her in hospital for covid.  NP questioned how she went 6 months while in [place] with no RX.  Patients daughter said her sister has tramadol and she gave her some of hers. I said you are stating someone else gave perscription drugs to your Mother - she said no just one because she is hurting.

## 2018-11-12 ENCOUNTER — Encounter: Payer: Self-pay | Admitting: Registered Nurse

## 2019-04-05 DIAGNOSIS — M79643 Pain in unspecified hand: Secondary | ICD-10-CM | POA: Diagnosis not present

## 2019-04-05 DIAGNOSIS — M159 Polyosteoarthritis, unspecified: Secondary | ICD-10-CM | POA: Diagnosis not present

## 2019-04-05 DIAGNOSIS — M7989 Other specified soft tissue disorders: Secondary | ICD-10-CM | POA: Diagnosis not present

## 2019-04-05 DIAGNOSIS — Z79899 Other long term (current) drug therapy: Secondary | ICD-10-CM | POA: Diagnosis not present

## 2019-04-05 DIAGNOSIS — M797 Fibromyalgia: Secondary | ICD-10-CM | POA: Diagnosis not present

## 2019-04-05 DIAGNOSIS — M0579 Rheumatoid arthritis with rheumatoid factor of multiple sites without organ or systems involvement: Secondary | ICD-10-CM | POA: Diagnosis not present

## 2019-04-12 ENCOUNTER — Encounter: Payer: Medicare Other | Attending: Registered Nurse | Admitting: Registered Nurse

## 2019-04-12 ENCOUNTER — Encounter: Payer: Self-pay | Admitting: Registered Nurse

## 2019-04-12 ENCOUNTER — Other Ambulatory Visit: Payer: Self-pay

## 2019-04-12 VITALS — BP 104/69 | HR 70 | Temp 99.7°F | Ht 60.0 in | Wt 135.0 lb

## 2019-04-12 DIAGNOSIS — M542 Cervicalgia: Secondary | ICD-10-CM | POA: Insufficient documentation

## 2019-04-12 DIAGNOSIS — M2550 Pain in unspecified joint: Secondary | ICD-10-CM | POA: Diagnosis not present

## 2019-04-12 DIAGNOSIS — M4716 Other spondylosis with myelopathy, lumbar region: Secondary | ICD-10-CM

## 2019-04-12 DIAGNOSIS — Z96611 Presence of right artificial shoulder joint: Secondary | ICD-10-CM

## 2019-04-12 DIAGNOSIS — M5030 Other cervical disc degeneration, unspecified cervical region: Secondary | ICD-10-CM | POA: Diagnosis not present

## 2019-04-12 MED ORDER — TRAMADOL HCL 50 MG PO TABS: 50.0000 mg | ORAL_TABLET | Freq: Two times a day (BID) | ORAL | 5 refills | Status: DC | PRN

## 2019-04-12 NOTE — Progress Notes (Signed)
Subjective:    Patient ID: Lindsey Richardson, female    DOB: 1935/02/02, 85 y.o.   MRN: [id number]  HPI: Lindsey Richardson is a 85 y.o. female who returns for follow up appointment for chronic pain and medication refill. She states her pain is located in her neck, right shoulder and bilateral knees. Also reports generalized joint pain all over. She rates her pain 8. Her current exercise regime is walking.   The LAS line was used to communicate with [name].  PMP was Reviewed, she hasn't been prescribed Tramadol since 05/25/2018. We will prescribed Tramadol today, she verbalizes understanding. Reviewed the above with her daughter, she verbalizes understanding.    Pain Inventory Average Pain 9 Pain Right Now 8 My pain is sharp, burning and aching  In the last 24 hours, has pain interfered with the following? General activity 1 Relation with others 2 Enjoyment of life 1 What TIME of day is your pain at its worst? daytime Sleep (in general) Poor  Pain is worse with: walking, bending and standing Pain improves with: . Relief from Meds: 0  Mobility walk without assistance ability to climb steps?  no do you drive?  no  Function retired  Neuro/Psych bladder control problems bowel control problems confusion  Prior Studies Any changes since last visit?  no  Physicians involved in your care Any changes since last visit?  no   No family history on file. Social History   Socioeconomic History  . Marital status: Widowed    Spouse name: Not on file  . Number of children: Not on file  . Years of education: Not on file  . Highest education level: Not on file  Occupational History  . Not on file  Tobacco Use  . Smoking status: Never Smoker  . Smokeless tobacco: Never Used  Substance and Sexual Activity  . Alcohol use: No  . Drug use: No  . Sexual activity: Not on file  Other Topics Concern  . Not on file  Social History Narrative  . Not on file   Social  Determinants of Health   Financial Resource Strain:   . Difficulty of Paying Living Expenses: Not on file  Food Insecurity:   . Worried About Charity fundraiser in the Last Year: Not on file  . Ran Out of Food in the Last Year: Not on file  Transportation Needs:   . Lack of Transportation (Medical): Not on file  . Lack of Transportation (Non-Medical): Not on file  Physical Activity:   . Days of Exercise per Week: Not on file  . Minutes of Exercise per Session: Not on file  Stress:   . Feeling of Stress : Not on file  Social Connections:   . Frequency of Communication with Friends and Family: Not on file  . Frequency of Social Gatherings with Friends and Family: Not on file  . Attends Religious Services: Not on file  . Active Member of Clubs or Organizations: Not on file  . Attends Archivist Meetings: Not on file  . Marital Status: Not on file   Past Surgical History:  Procedure Laterality Date  . HERNIA REPAIR    . REVERSE SHOULDER ARTHROPLASTY Right 01/01/2017  . REVERSE SHOULDER ARTHROPLASTY Right 01/01/2017   Procedure: REVERSE SHOULDER ARTHROPLASTY;  Surgeon: Tania Ade, MD;  Location: [facility];  Service: Orthopedics;  Laterality: Right;  Right reverse total shoulder arthroplasty   Past Medical History:  Diagnosis Date  . Arthritis   .  GERD (gastroesophageal reflux disease)    pt denies  . Hyperlipidemia   . Migraine   . Osteopenia   . Osteoporosis   . Vitamin D deficiency    BP 104/69   Pulse 70   Temp 99.7 F (37.6 C)   Ht 5' (1.524 m)   Wt 135 lb (61.2 kg)   SpO2 96%   BMI 26.37 kg/m   Opioid Risk Score:   Fall Risk Score:  `1  Depression screen PHQ 2/9  Depression screen PHQ 2/9 04/27/2018  Decreased Interest 1  Down, Depressed, Hopeless 1  PHQ - 2 Score 2  Altered sleeping 1  Tired, decreased energy 2  Change in appetite 1  Feeling bad or failure about yourself  3  Trouble concentrating 2  Moving slowly or fidgety/restless 2   Suicidal thoughts 0  PHQ-9 Score 13  Difficult doing work/chores Very difficult     Review of Systems  Constitutional: Negative.   HENT: Negative.   Eyes: Negative.   Respiratory: Negative.   Cardiovascular: Negative.   Gastrointestinal: Positive for constipation.  Endocrine: Negative.   Genitourinary: Positive for difficulty urinating.  Musculoskeletal: Positive for arthralgias.  Skin: Negative.   Allergic/Immunologic: Negative.   Neurological: Negative.   Hematological: Negative.   Psychiatric/Behavioral: Positive for confusion.  All other systems reviewed and are negative.      Objective:   Physical Exam Vitals and nursing note reviewed.  Constitutional:      Appearance: Normal appearance.  Neck:     Comments: Cervical Paraspinal Tenderness: C-5-C-6 Cardiovascular:     Rate and Rhythm: Normal rate and regular rhythm.     Pulses: Normal pulses.     Heart sounds: Normal heart sounds.  Pulmonary:     Effort: Pulmonary effort is normal.     Breath sounds: Normal breath sounds.  Musculoskeletal:     Cervical back: Normal range of motion and neck supple.     Comments: Normal Muscle Bulk and Muscle Testing Reveals:  Upper Extremities: Right: Decreased ROM 35 Degrees and Muscle Strength 4/5 Left: Full ROM and Muscle Strength  5/5  Lower Extremities: Full ROM and Muscle Strength 5/5 Bilateral Lower Extremities Flexion Produces Pain into Bilateral Hips and Bilateral Lower Extremities. Arises from Table slowly Narrow Based Gait   Skin:    General: Skin is warm and dry.  Neurological:     Mental Status: She is alert and oriented to person, place, and time.  Psychiatric:        Mood and Affect: Mood normal.        Behavior: Behavior normal.           Assessment & Plan:  1. S/P Reverse Total Shoulder arthroplasty: S/P 01/01/2017. Continue HEP as Tolerated. Continue to Monitor. 04/11/2018 2.  Lumbar Spondylosis: Continue HEP as Tolerated. [name] denies any  pain at this time. 04/11/2018 3.Polyarthralgia: Continue to alternate with Heat and Ice Therapy. Continue to monitor.  4. Chronic Pain Syndrome: RX: Tramadol 50 mg one tablet twice a day as needed for pain #60.   30 minutes of face to face patient care time was spent during this visit. All questions were encouraged and answered.  F/U in 6 months.

## 2019-04-18 ENCOUNTER — Telehealth: Payer: Self-pay

## 2019-04-18 NOTE — Telephone Encounter (Signed)
Patient daughter called stating that patient is taking Tramadol one twice a day but it doesn't help. Would like to take more.

## 2019-04-19 NOTE — Telephone Encounter (Signed)
Return Daughter [name]) call, no answer, left message to return the call.

## 2019-04-20 MED ORDER — TRAMADOL HCL 50 MG PO TABS: 50.0000 mg | ORAL_TABLET | Freq: Three times a day (TID) | ORAL | 5 refills | Status: DC | PRN

## 2019-04-20 NOTE — Telephone Encounter (Signed)
Placed a call to pharmacy, previous prescription was removed. New prescription for Tramadol was e-scribed. Ms. [name] is aware of the above.

## 2019-04-20 NOTE — Telephone Encounter (Signed)
[name] (daughter) called back stating that she missed the call.  She says, "Please call me back"

## 2019-04-20 NOTE — Telephone Encounter (Signed)
Return Ms. Nada call, she reports her mother Ms. Gursky only receiving 2 hours of relief of her pain with her current dose of Tramadol. We will allow her to increase her tramadol to three times a day as needed for pain. She verbalizes understanding.

## 2019-04-20 NOTE — Addendum Note (Signed)
Addended by: Bayard Hugger on: 04/20/2019 04:32 PM   Modules accepted: Orders

## 2019-04-28 DIAGNOSIS — R351 Nocturia: Secondary | ICD-10-CM | POA: Diagnosis not present

## 2019-04-28 DIAGNOSIS — N3941 Urge incontinence: Secondary | ICD-10-CM | POA: Diagnosis not present

## 2019-04-29 DIAGNOSIS — H02009 Unspecified entropion of unspecified eye, unspecified eyelid: Secondary | ICD-10-CM | POA: Diagnosis not present

## 2019-05-11 ENCOUNTER — Ambulatory Visit: Payer: Medicare Other | Admitting: Registered Nurse

## 2019-05-30 DIAGNOSIS — H02011 Cicatricial entropion of right upper eyelid: Secondary | ICD-10-CM | POA: Diagnosis not present

## 2019-05-30 DIAGNOSIS — H02051 Trichiasis without entropian right upper eyelid: Secondary | ICD-10-CM | POA: Diagnosis not present

## 2019-05-30 DIAGNOSIS — H02054 Trichiasis without entropian left upper eyelid: Secondary | ICD-10-CM | POA: Diagnosis not present

## 2019-05-30 DIAGNOSIS — H02014 Cicatricial entropion of left upper eyelid: Secondary | ICD-10-CM | POA: Diagnosis not present

## 2019-05-30 DIAGNOSIS — H40053 Ocular hypertension, bilateral: Secondary | ICD-10-CM | POA: Diagnosis not present

## 2019-05-30 DIAGNOSIS — H353130 Nonexudative age-related macular degeneration, bilateral, stage unspecified: Secondary | ICD-10-CM | POA: Diagnosis not present

## 2019-05-30 DIAGNOSIS — H11233 Symblepharon, bilateral: Secondary | ICD-10-CM | POA: Diagnosis not present

## 2019-05-30 DIAGNOSIS — H35373 Puckering of macula, bilateral: Secondary | ICD-10-CM | POA: Diagnosis not present

## 2019-05-30 DIAGNOSIS — H04123 Dry eye syndrome of bilateral lacrimal glands: Secondary | ICD-10-CM | POA: Diagnosis not present

## 2019-05-30 DIAGNOSIS — H26493 Other secondary cataract, bilateral: Secondary | ICD-10-CM | POA: Diagnosis not present

## 2019-06-30 DIAGNOSIS — H353130 Nonexudative age-related macular degeneration, bilateral, stage unspecified: Secondary | ICD-10-CM | POA: Diagnosis not present

## 2019-06-30 DIAGNOSIS — H40053 Ocular hypertension, bilateral: Secondary | ICD-10-CM | POA: Diagnosis not present

## 2019-06-30 DIAGNOSIS — H26493 Other secondary cataract, bilateral: Secondary | ICD-10-CM | POA: Diagnosis not present

## 2019-06-30 DIAGNOSIS — H35373 Puckering of macula, bilateral: Secondary | ICD-10-CM | POA: Diagnosis not present

## 2019-06-30 DIAGNOSIS — H04123 Dry eye syndrome of bilateral lacrimal glands: Secondary | ICD-10-CM | POA: Diagnosis not present

## 2019-07-18 DIAGNOSIS — H26491 Other secondary cataract, right eye: Secondary | ICD-10-CM | POA: Diagnosis not present

## 2019-08-11 NOTE — Telephone Encounter (Signed)
Old note closing chart 

## 2019-10-07 ENCOUNTER — Telehealth: Payer: Self-pay

## 2019-10-07 NOTE — Telephone Encounter (Signed)
Daughter of patient called stating patient needs refill on Tramadol. She is still in [place] and is scheduled for eye surgery so patient can't make it to her up coming appt at this office.

## 2019-10-07 NOTE — Telephone Encounter (Signed)
I spoke with [name] and she said she is not staying down there but is coming back in about a month. She wanted me to ask again.

## 2019-10-07 NOTE — Telephone Encounter (Signed)
The patient will need to find a doctor in [place] to do this

## 2019-10-08 NOTE — Telephone Encounter (Signed)
Needs phone visit with Zella Ball

## 2019-10-11 ENCOUNTER — Encounter: Payer: Medicare Other | Admitting: Physical Medicine & Rehabilitation

## 2019-10-12 NOTE — Telephone Encounter (Signed)
I spoke with Lindsey Richardson and she is going to call her and will then call us back about the appointment.

## 2019-10-19 ENCOUNTER — Ambulatory Visit: Payer: Medicare Other | Admitting: Registered Nurse

## 2020-01-16 DIAGNOSIS — H16223 Keratoconjunctivitis sicca, not specified as Sjogren's, bilateral: Secondary | ICD-10-CM | POA: Diagnosis not present

## 2020-01-16 DIAGNOSIS — H16143 Punctate keratitis, bilateral: Secondary | ICD-10-CM | POA: Diagnosis not present

## 2020-03-14 DIAGNOSIS — H26492 Other secondary cataract, left eye: Secondary | ICD-10-CM | POA: Diagnosis not present

## 2020-03-14 DIAGNOSIS — H40053 Ocular hypertension, bilateral: Secondary | ICD-10-CM | POA: Diagnosis not present

## 2020-04-02 DIAGNOSIS — R413 Other amnesia: Secondary | ICD-10-CM | POA: Diagnosis not present

## 2020-04-02 DIAGNOSIS — H5713 Ocular pain, bilateral: Secondary | ICD-10-CM | POA: Diagnosis not present

## 2020-04-02 DIAGNOSIS — E538 Deficiency of other specified B group vitamins: Secondary | ICD-10-CM | POA: Diagnosis not present

## 2020-04-03 ENCOUNTER — Encounter: Payer: Self-pay | Admitting: Neurology

## 2020-04-04 DIAGNOSIS — H16223 Keratoconjunctivitis sicca, not specified as Sjogren's, bilateral: Secondary | ICD-10-CM | POA: Diagnosis not present

## 2020-04-04 DIAGNOSIS — H5713 Ocular pain, bilateral: Secondary | ICD-10-CM | POA: Diagnosis not present

## 2020-04-04 DIAGNOSIS — H35373 Puckering of macula, bilateral: Secondary | ICD-10-CM | POA: Diagnosis not present

## 2020-04-04 DIAGNOSIS — Z961 Presence of intraocular lens: Secondary | ICD-10-CM | POA: Diagnosis not present

## 2020-04-10 ENCOUNTER — Encounter: Payer: Self-pay | Admitting: Registered Nurse

## 2020-04-10 ENCOUNTER — Other Ambulatory Visit: Payer: Self-pay

## 2020-04-10 ENCOUNTER — Encounter: Payer: Medicare Other | Attending: Registered Nurse | Admitting: Registered Nurse

## 2020-04-10 VITALS — BP 118/71 | HR 69 | Temp 98.1°F | Ht 60.0 in | Wt 131.0 lb

## 2020-04-10 DIAGNOSIS — M2550 Pain in unspecified joint: Secondary | ICD-10-CM | POA: Insufficient documentation

## 2020-04-10 DIAGNOSIS — G8929 Other chronic pain: Secondary | ICD-10-CM | POA: Insufficient documentation

## 2020-04-10 DIAGNOSIS — M5442 Lumbago with sciatica, left side: Secondary | ICD-10-CM | POA: Insufficient documentation

## 2020-04-10 DIAGNOSIS — Z79899 Other long term (current) drug therapy: Secondary | ICD-10-CM | POA: Diagnosis present

## 2020-04-10 DIAGNOSIS — M4716 Other spondylosis with myelopathy, lumbar region: Secondary | ICD-10-CM | POA: Insufficient documentation

## 2020-04-10 DIAGNOSIS — M25512 Pain in left shoulder: Secondary | ICD-10-CM | POA: Diagnosis present

## 2020-04-10 DIAGNOSIS — M5030 Other cervical disc degeneration, unspecified cervical region: Secondary | ICD-10-CM | POA: Diagnosis present

## 2020-04-10 DIAGNOSIS — Z5181 Encounter for therapeutic drug level monitoring: Secondary | ICD-10-CM | POA: Diagnosis present

## 2020-04-10 DIAGNOSIS — M5441 Lumbago with sciatica, right side: Secondary | ICD-10-CM | POA: Insufficient documentation

## 2020-04-10 DIAGNOSIS — M25511 Pain in right shoulder: Secondary | ICD-10-CM | POA: Diagnosis present

## 2020-04-10 MED ORDER — TRAMADOL HCL 50 MG PO TABS: 50.0000 mg | ORAL_TABLET | Freq: Three times a day (TID) | ORAL | 5 refills | Status: DC | PRN

## 2020-04-10 NOTE — Progress Notes (Signed)
Subjective:    Patient ID: Lindsey Richardson, female    DOB: 1934/08/18, 86 y.o.   MRN: [id number]  HPI: Lindsey Richardson is a 86 y.o. female who returns for follow up appointment for chronic pain and medication refill. She arrived with daughter and interpreter Hanan, she states her pain is located in her lower back and generalized joint pain. She rates her pain 6. Her current exercise regime is walking.   [name] last Tramodol prescription was filled on 09/29/2019, her daughter [name] states she was in [place] in July and  with her sister in [place], for three months. She's only been home for the last 2 weeks, she reports.   Oral Swab was Performed today.   Pain Inventory Average Pain 9 Pain Right Now 6 My pain is intermittent, constant, sharp and aching  In the last 24 hours, has pain interfered with the following? General activity 7 Relation with others 9 Enjoyment of life 9 What TIME of day is your pain at its worst? daytime and night Sleep (in general) Fair  Pain is worse with: walking, bending, sitting and standing Pain improves with: rest and medication Relief from Meds: fair to good  No family history on file. Social History   Socioeconomic History  . Marital status: Widowed    Spouse name: Not on file  . Number of children: Not on file  . Years of education: Not on file  . Highest education level: Not on file  Occupational History  . Not on file  Tobacco Use  . Smoking status: Never Smoker  . Smokeless tobacco: Never Used  Vaping Use  . Vaping Use: Never used  Substance and Sexual Activity  . Alcohol use: No  . Drug use: No  . Sexual activity: Not on file  Other Topics Concern  . Not on file  Social History Narrative  . Not on file   Social Determinants of Health   Financial Resource Strain: Not on file  Food Insecurity: Not on file  Transportation Needs: Not on file  Physical Activity: Not on file  Stress: Not on file  Social Connections: Not on  file   Past Surgical History:  Procedure Laterality Date  . HERNIA REPAIR    . REVERSE SHOULDER ARTHROPLASTY Right 01/01/2017  . REVERSE SHOULDER ARTHROPLASTY Right 01/01/2017   Procedure: REVERSE SHOULDER ARTHROPLASTY;  Surgeon: Tania Ade, MD;  Location: [facility];  Service: Orthopedics;  Laterality: Right;  Right reverse total shoulder arthroplasty   Past Surgical History:  Procedure Laterality Date  . HERNIA REPAIR    . REVERSE SHOULDER ARTHROPLASTY Right 01/01/2017  . REVERSE SHOULDER ARTHROPLASTY Right 01/01/2017   Procedure: REVERSE SHOULDER ARTHROPLASTY;  Surgeon: Tania Ade, MD;  Location: [facility];  Service: Orthopedics;  Laterality: Right;  Right reverse total shoulder arthroplasty   Past Medical History:  Diagnosis Date  . Arthritis   . GERD (gastroesophageal reflux disease)    pt denies  . Hyperlipidemia   . Migraine   . Osteopenia   . Osteoporosis   . Vitamin D deficiency    There were no vitals taken for this visit.  Opioid Risk Score:   Fall Risk Score:  `1  Depression screen PHQ 2/9  Depression screen PHQ 2/9 04/27/2018  Decreased Interest 1  Down, Depressed, Hopeless 1  PHQ - 2 Score 2  Altered sleeping 1  Tired, decreased energy 2  Change in appetite 1  Feeling bad or failure about yourself  3  Trouble concentrating 2  Moving slowly or fidgety/restless 2  Suicidal thoughts 0  PHQ-9 Score 13  Difficult doing work/chores Very difficult   Review of Systems  Musculoskeletal: Positive for back pain and gait problem.       Shoulder, hands & fingers  All other systems reviewed and are negative.      Objective:   Physical Exam Vitals and nursing note reviewed.  Constitutional:      Appearance: Normal appearance.  Cardiovascular:     Rate and Rhythm: Normal rate and regular rhythm.     Pulses: Normal pulses.     Heart sounds: Normal heart sounds.  Pulmonary:     Effort: Pulmonary effort is normal.     Breath sounds: Normal breath sounds.   Musculoskeletal:     Cervical back: Normal range of motion and neck supple.     Comments: Normal Muscle Bulk and Muscle Testing Reveals:  Upper Extremities: Right: Decreased ROM 30 Degrees and Muscle Strength 4/5 Left Upper Extremity: Full ROM and Muscle Strength 5/5 Bilateral AC Joint Tenderness Lumbar Paraspinal Tenderness: L-3-L-5 Lower Extremities: Full ROM and Muscle Strength 5/5 Arises from Table with Ease Narrow Based  Gait   Skin:    General: Skin is warm and dry.  Neurological:     Mental Status: She is alert and oriented to person, place, and time.  Psychiatric:        Mood and Affect: Mood normal.        Behavior: Behavior normal.           Assessment & Plan:  1. S/P Reverse Total Shoulder arthroplasty: S/P 01/01/2017. Continue HEP as Tolerated. Continue to Monitor. 04/10/2020. 2. Lumbar Spondylosis: Continue HEP as Tolerated. [name] denies any pain at this time. 04/10/2020 3.Polyarthralgia: Continue to alternate with Heat and Ice Therapy. Continue to monitor. 04/10/2020 4. Chronic Pain Syndrome: RX: Tramadol 50 mg one tablet twice a day as needed for pain #60.   F/U in 6 months.

## 2020-04-14 LAB — DRUG TOX MONITOR 1 W/CONF, ORAL FLD

## 2020-04-14 LAB — DRUG TOX METHYLPHEN W/CONF,ORAL FLD: Methylphenidate: NEGATIVE ng/mL (ref <1.0)

## 2020-04-14 LAB — DRUG TOX ALC METAB W/CON, ORAL FLD: Alcohol Metabolite: NEGATIVE ng/mL (ref <25)

## 2020-05-15 ENCOUNTER — Ambulatory Visit: Payer: Medicare Other | Admitting: Neurology

## 2020-05-28 ENCOUNTER — Telehealth: Payer: Self-pay | Admitting: Registered Nurse

## 2020-05-28 NOTE — Telephone Encounter (Signed)
Daughter: Lindsey Richardson stated her mother is only receiving 21 tablets of the Tramadol per week. Placed a call to pharmacy, she needs a prior authorization. A PA will be submitted . The above will be discussed with her daughter Lindsey Richardson.

## 2020-06-21 ENCOUNTER — Ambulatory Visit: Payer: Medicare Other | Admitting: Neurology

## 2020-09-06 ENCOUNTER — Ambulatory Visit: Payer: Medicare Other | Admitting: Physician Assistant

## 2020-09-18 ENCOUNTER — Other Ambulatory Visit: Payer: Self-pay

## 2020-09-18 ENCOUNTER — Ambulatory Visit (INDEPENDENT_AMBULATORY_CARE_PROVIDER_SITE_OTHER): Payer: Medicare Other | Admitting: Physician Assistant

## 2020-09-18 ENCOUNTER — Encounter: Payer: Self-pay | Admitting: Physician Assistant

## 2020-09-18 VITALS — BP 125/73 | HR 78 | Ht <= 58 in | Wt 133.4 lb

## 2020-09-18 DIAGNOSIS — F0280 Dementia in other diseases classified elsewhere without behavioral disturbance: Secondary | ICD-10-CM

## 2020-09-18 DIAGNOSIS — F0150 Vascular dementia without behavioral disturbance: Secondary | ICD-10-CM

## 2020-09-18 DIAGNOSIS — G309 Alzheimer's disease, unspecified: Secondary | ICD-10-CM | POA: Diagnosis not present

## 2020-09-18 DIAGNOSIS — R413 Other amnesia: Secondary | ICD-10-CM | POA: Diagnosis not present

## 2020-09-18 MED ORDER — MEMANTINE HCL 10 MG PO TABS: ORAL_TABLET | ORAL | 11 refills | Status: DC

## 2020-09-18 NOTE — Patient Instructions (Addendum)
It was a pleasure to see you today at our office.   Recommendations:  Neurocognitive evaluation at our office MRI of the brain, the office will call you to arrange you appointment Follow up once the results of the above are available   RECOMMENDATIONS FOR ALL PATIENTS WITH MEMORY PROBLEMS: 1. Continue to exercise (Recommend 30 minutes of walking everyday, or 3 hours every week) 2. Increase social interactions - continue going to [place] and enjoy social gatherings with friends and family 3. Eat healthy, avoid fried foods and eat more fruits and vegetables 4. Maintain adequate blood pressure, blood sugar, and blood cholesterol level. Reducing the risk of stroke and cardiovascular disease also helps promoting better memory. 5. Avoid stressful situations. Live a simple life and avoid aggravations. Organize your time and prepare for the next day in anticipation. 6. Sleep well, avoid any interruptions of sleep and avoid any distractions in the bedroom that may interfere with adequate sleep quality 7. Avoid sugar, avoid sweets as there is a strong link between excessive sugar intake, diabetes, and cognitive impairment We discussed the Mediterranean diet, which has been shown to help patients reduce the risk of progressive memory disorders and reduces cardiovascular risk. This includes eating fish, eat fruits and green leafy vegetables, nuts like almonds and hazelnuts, walnuts, and also use olive oil. Avoid fast foods and fried foods as much as possible. Avoid sweets and sugar as sugar use has been linked to worsening of memory function.  There is always a concern of gradual progression of memory problems. If this is the case, then we may need to adjust level of care according to patient needs. Support, both to the patient and caregiver, should then be put into place.      You have been referred for a neuropsychological evaluation (i.e., evaluation of memory and thinking abilities). Please bring  someone with you to this appointment if possible, as it is helpful for the doctor to hear from both you and another adult who knows you well. Please bring eyeglasses and hearing aids if you wear them.    The evaluation will take approximately 3 hours and has two parts:   The first part is a clinical interview with the neuropsychologist (Dr. Melvyn Novas or Dr. Nicole Kindred). During the interview, the neuropsychologist will speak with you and the individual you brought to the appointment.    The second part of the evaluation is testing with the doctor's technician Hinton Dyer or Maudie Mercury). During the testing, the technician will ask you to remember different types of material, solve problems, and answer some questionnaires. Your family member will not be present for this portion of the evaluation.   Please note: We must reserve several hours of the neuropsychologist's time and the psychometrician's time for your evaluation appointment. As such, there is a No-Show fee of $100. If you are unable to attend any of your appointments, please contact our office as soon as possible to reschedule.    FALL PRECAUTIONS: Be cautious when walking. Scan the area for obstacles that may increase the risk of trips and falls. When getting up in the mornings, sit up at the edge of the bed for a few minutes before getting out of bed. Consider elevating the bed at the head end to avoid drop of blood pressure when getting up. Walk always in a well-lit room (use night lights in the walls). Avoid area rugs or power cords from appliances in the middle of the walkways. Use a walker or a cane if  necessary and consider physical therapy for balance exercise. Get your eyesight checked regularly.  FINANCIAL OVERSIGHT: Supervision, especially oversight when making financial decisions or transactions is also recommended.  HOME SAFETY: Consider the safety of the kitchen when operating appliances like stoves, microwave oven, and blender. Consider having  supervision and share cooking responsibilities until no longer able to participate in those. Accidents with firearms and other hazards in the house should be identified and addressed as well.   ABILITY TO BE LEFT ALONE: If patient is unable to contact 911 operator, consider using LifeLine, or when the need is there, arrange for someone to stay with patients. Smoking is a fire hazard, consider supervision or cessation. Risk of wandering should be assessed by caregiver and if detected at any point, supervision and safe proof recommendations should be instituted.  MEDICATION SUPERVISION: Inability to self-administer medication needs to be constantly addressed. Implement a mechanism to ensure safe administration of the medications.   DRIVING: Regarding driving, in patients with progressive memory problems, driving will be impaired. We advise to have someone else do the driving if trouble finding directions or if minor accidents are reported. Independent driving assessment is available to determine safety of driving.   If you are interested in the driving assessment, you can contact the following:  [employer] in [place]  [place] [phone] or [phone]    [place] refers to food and lifestyle choices that are based on the traditions of countries located on the [employer]. This way of eating has been shown to help prevent certain conditions and improve outcomes for people who have chronic diseases, like kidney disease and heart disease. What are tips for following this plan? Lifestyle  Cook and eat meals together with your family, when possible. Drink enough fluid to keep your urine clear or pale yellow. Be physically active every day. This includes: Aerobic exercise like running or swimming. Leisure activities like gardening,  walking, or housework. Get 7-8 hours of sleep each night. If recommended by your health care provider, drink red wine in moderation. This means 1 glass a day for nonpregnant women and 2 glasses a day for men. A glass of wine equals 5 oz (150 mL). Reading food labels  Check the serving size of packaged foods. For foods such as rice and pasta, the serving size refers to the amount of cooked product, not dry. Check the total fat in packaged foods. Avoid foods that have saturated fat or trans fats. Check the ingredients list for added sugars, such as corn syrup. Shopping  At the grocery store, buy most of your food from the areas near the walls of the store. This includes: Fresh fruits and vegetables (produce). Grains, beans, nuts, and seeds. Some of these may be available in unpackaged forms or large amounts (in bulk). Fresh seafood. Poultry and eggs. Low-fat dairy products. Buy whole ingredients instead of prepackaged foods. Buy fresh fruits and vegetables in-season from local farmers markets. Buy frozen fruits and vegetables in resealable bags. If you do not have access to quality fresh seafood, buy precooked frozen shrimp or canned fish, such as tuna, salmon, or sardines. Buy small amounts of raw or cooked vegetables, salads, or olives from the deli or salad bar at your store. Stock your pantry so you always have certain foods on hand, such as olive oil, canned tuna, canned tomatoes, rice, pasta, and beans. Cooking  [employer]  with extra-virgin olive oil instead of using butter or other vegetable oils. Have meat as a side dish, and have vegetables or grains as your main dish. This means having meat in small portions or adding small amounts of meat to foods like pasta or stew. Use beans or vegetables instead of meat in common dishes like chili or lasagna. Experiment with different cooking methods. Try roasting or broiling vegetables instead of steaming or sauteing them. Add frozen vegetables  to soups, stews, pasta, or rice. Add nuts or seeds for added healthy fat at each meal. You can add these to yogurt, salads, or vegetable dishes. Marinate fish or vegetables using olive oil, lemon juice, garlic, and fresh herbs. Meal planning  Plan to eat 1 vegetarian meal one day each week. Try to work up to 2 vegetarian meals, if possible. Eat seafood 2 or more times a week. Have healthy snacks readily available, such as: Vegetable sticks with hummus. Greek yogurt. Fruit and nut trail mix. Eat balanced meals throughout the week. This includes: Fruit: 2-3 servings a day Vegetables: 4-5 servings a day Low-fat dairy: 2 servings a day Fish, poultry, or lean meat: 1 serving a day Beans and legumes: 2 or more servings a week Nuts and seeds: 1-2 servings a day Whole grains: 6-8 servings a day Extra-virgin olive oil: 3-4 servings a day Limit red meat and sweets to only a few servings a month What are my food choices? Mediterranean diet Recommended Grains: Whole-grain pasta. Brown rice. Bulgar wheat. Polenta. Couscous. Whole-wheat bread. [place]. Vegetables: Artichokes. Beets. Broccoli. Cabbage. Carrots. Eggplant. Green beans. Chard. Kale. Spinach. Onions. Leeks. Peas. Squash. Tomatoes. Peppers. Radishes. Fruits: Apples. Apricots. Avocado. Berries. Bananas. Cherries. Dates. Figs. Grapes. Lemons. Melon. Oranges. Peaches. Plums. Pomegranate. Meats and other protein foods: Beans. Almonds. Sunflower seeds. Pine nuts. Peanuts. [place]. Salmon. Scallops. Shrimp. [place]. Tilapia. Clams. Oysters. Eggs. Dairy: Low-fat milk. Cheese. Greek yogurt. Beverages: Water. Red wine. Herbal tea. Fats and oils: Extra virgin olive oil. Avocado oil. Grape seed oil. Sweets and desserts: [place] yogurt with honey. Baked apples. Poached pears. Trail mix. Seasoning and other foods: Basil. Cilantro. Coriander. Cumin. Mint. Parsley. Sage. Rosemary. Tarragon. Garlic. Oregano. Thyme. Pepper. Balsalmic vinegar. Tahini.  Hummus. Tomato sauce. Olives. Mushrooms. Limit these Grains: Prepackaged pasta or rice dishes. Prepackaged cereal with added sugar. Vegetables: Deep fried potatoes (french fries). Fruits: Fruit canned in syrup. Meats and other protein foods: Beef. Pork. Lamb. Poultry with skin. Hot dogs. [place] Salines. Dairy: Ice cream. Sour cream. Whole milk. Beverages: Juice. Sugar-sweetened soft drinks. Beer. Liquor and spirits. Fats and oils: Butter. Canola oil. Vegetable oil. Beef fat (tallow). Lard. Sweets and desserts: Cookies. Cakes. Pies. Candy. Seasoning and other foods: Mayonnaise. Premade sauces and marinades. The items listed may not be a complete list. Talk with your dietitian about what dietary choices are right for you. Summary The Mediterranean diet includes both food and lifestyle choices. Eat a variety of fresh fruits and vegetables, beans, nuts, seeds, and whole grains. Limit the amount of red meat and sweets that you eat. Talk with your health care provider about whether it is safe for you to drink red wine in moderation. This means 1 glass a day for nonpregnant women and 2 glasses a day for men. A glass of wine equals 5 oz (150 mL). This information is not intended to replace advice given to you by your health care provider. Make sure you discuss any questions you have with your health care provider. Document Released: 11/15/2015 Document Revised: 12/18/2015  Document Reviewed: 11/15/2015 Elsevier Interactive Patient Education  2017 [employer].

## 2020-09-18 NOTE — Progress Notes (Signed)
Assessment/Plan:   86 y.o. year old female with risk factors including age, polyarthralgia, rheumatoid arthritis, hyperlipidemia, migraines followed as [facility], remote brain injury, seen today for evaluation of memory loss.  MMSE was 7/30, (may be higher in view that the patient is illiterate and non-English Speaker) however, there is obvious deficiency in visuospatial ability, language, immediate and delayed recall as well as abstraction suggestive of moderate to severe dementia likely due to AD.  Recommendations are as follows :  Mix Dementia, moderate to severe likely due to Alzheimer's Disease and Vascular  MRI of the brain to evaluate for bleeding, brain size and other abnormalities Discussed safety both in and out of the home. Recommended a sitter to help her daughter, but she decline at this time.  Discussed the importance of regular daily schedule to maintain brain function.  Continue to monitor mood with PCP.  Stay active as tolerated Naps should be scheduled and should be no longer than 60 minutes and should not occur after 2 PM.  Folllow up once results above are available   Subjective:    The patient is seen in neurologic consultation at the request of [name], MD for the evaluation of memory.  The patient is accompanied by daughter who supplements the history.Patient wants daughter only as an interpreter and waiver for declining these services has been signed. The majority of the information is obtained from her daughter due to the patient's dementia.  The patient is a very pleasant 86 y.o. year old Lindsey Richardson  female who has had memory issues for the last 3 years, more noticeable over the last 12 months.  "Starting with forgetting what she was doing when entering the room, or looking for her lunch, when the food was in front of her ".  When asked the patient how her memory is, she reports "excellent ". The patient has been  denying her symptoms "forever".  Sometimes, she begins to cry for no reason.  "She also talks to people that they are not in the room, especially at night".  She frequently talks to her dead husband (he died 74 years ago).  She also has conversations with people who are alive but they are out of state. At times, she reports animals in the room. She goes to sleep at midnight, and she wakes up around 1pm, but that " has been always have a pattern".  She takes occasionally a nap. When sleeping, daughter hears her screaming at times, but denies sleepwalking. The patient lives with her daughter and her daughter's family, and her mood is mostly good, without irritability. Other than the hallucinations mentioned above, there is no apparent paranoia.  The patient dresses up and bathes without help. Daughter is in charge of the medications, finances and driving.  She cooks with assistance, has not been leaving the stove or the faucet on.  Denies leaving objects in unusual places.  She ambulates without difficulty without a walker or a cane.  She used to spend her time visiting and staying with her other 2 children, one in the [place], and one in [place] until moving with her.   She has a history of falls especially in 2019 with head injury.  The patient has a history of migraine headaches for at least a year, with a frequency of 3 times a week, accompanied by photophobia.  She has to lay down, until these subside.  Sometimes she takes Tylenol, and at  times she takes Advil.  Per chart note, the patient is being seen at the headache clinic, although no notes are available for review.  She denies any double vision, but she has ocular cicatricial pemphigoid and dry eyes treated by ophthalmologist at [facility].  Denies vertigo, dizziness, focal numbness or tingling, unilateral weakness or tremors. She has chronic urine incontinence and uses diapers. The patient has a history of constipation as well.  She denies any alcohol or  tobacco history. There is no known family history of dementia. She does not have school education.  It appears that the patient had already brain atrophy dating back to April 2011 per MRI available for review:   CT head 04/20/17 taken for headaches and memory loss, s/p head injury 5 months prior No acute intracranial abnormality.Chronic microvascular changes. Atrophy.  Remote MRI 07/2009 taken for headaches noted mild atrophy and small vessel disease  04/02/20 labs:  TSH 0.78 B12 716  08/16/20 CBC normal  Allergies  Allergen Reactions   Pork-Derived Products Other (See Comments)    Religious preference    Current Outpatient Medications  Medication Instructions   memantine (NAMENDA) 10 MG tablet Take 1 tablet (10 mg at night) for 2 weeks, then increase to 1 tablet (10 mg) twice a day   neomycin-polymyxin b-dexamethasone (MAXITROL) 3.5-10000-0.1 OINT Daily at bedtime   traMADol (ULTRAM) 50 mg, Oral, 3 times daily PRN     VITALS:   Vitals:   09/18/20 1455  BP: 125/73  Pulse: 78  SpO2: 96%  Weight: 133 lb 6.4 oz (60.5 kg)  Height: 4\' 10"  (1.473 m)   Depression screen [facility] 2/9 04/10/2020 04/27/2018  Decreased Interest 1 1  Down, Depressed, Hopeless 1 1  PHQ - 2 Score 2 2  Altered sleeping - 1  Tired, decreased energy - 2  Change in appetite - 1  Feeling bad or failure about yourself  - 3  Trouble concentrating - 2  Moving slowly or fidgety/restless - 2  Suicidal thoughts - 0  PHQ-9 Score - 13  Difficult doing work/chores - Very difficult    MMSE - Mini Mental State Exam 09/19/2020  Orientation to time 2  Orientation to time comments Spring, Tuesday  Orientation to Place 1  Orientation to Place-comments Dr. Gabriel Carina  Registration 2  Registration-comments they were asked in Arabic by her daughter  Attention/ Calculation 0  Recall 0  Language- name 2 objects 2  Language- repeat 0  Language- follow 3 step command 0  Language- read & follow direction 0  Language-read &  follow direction-comments unable to read  Write a sentence 0  Write a sentence-comments patient cannot write  Copy design 0  Total score 7    HEENT:  Normocephalic, atraumatic. The mucous membranes are moist. The superficial temporal arteries are ropey bilaterally without tenderness Cardiovascular: Regular rate and rhythm. Lungs: Clear to auscultation bilaterally. Neck: There are no carotid bruits noted bilaterally.  NEUROLOGICAL:  Orientation:  Alert and oriented to person, place and day of the week and season.  No aphasia or dysarthria.  Fund of knowledge is reduced.  Recent and remote memory are impaired.  Attention and concentration are impaired.  Able to name objects and unable to repeat phrases. Cranial nerves: There is good facial symmetry. Extraocular muscles are intact and visual fields are reduced to confrontational testing. Speech is not fluent bur clear in her own language per daughter report.   Soft palate rises symmetrically and there is no tongue deviation. Hearing is  intact to conversational tone. Tone: Tone is good throughout. Sensation: Sensation is intact to light touch and pinprick throughout. Vibration is intact at the bilateral big toe. There is no extinction with double simultaneous stimulation. There is no sensory dermatomal level identified. Coordination:  The patient has no difficulty with RAM's or FNF bilaterally. Normal finger to nose  Motor: Strength is 5/5 in the bilateral upper and lower extremities. There is no pronator drift.  There are no fasciculations noted. DTR's: Deep tendon reflexes are 2/4 at the bilateral biceps, triceps, brachioradialis, patella and achilles.  Plantar responses are downgoing bilaterally. Gait and Station: The patient is able to ambulate without difficulty. The patient is unable to heel toe walk due to pain (arthritis). The patient is able to ambulate in a tandem fashion. The patient is able to stand in the Romberg position.       Total time spent on today's visit was  60 minutes, including both face-to-face time and nonface-to-face time.  Time included that spent on review of records (prior notes available to me/labs/imaging if pertinent), discussing treatment and goals, answering patient's questions and coordinating care.  Cc:  [name], MD  Sharene Butters 09/19/2020 7:39 AM

## 2020-09-19 DIAGNOSIS — M069 Rheumatoid arthritis, unspecified: Secondary | ICD-10-CM | POA: Insufficient documentation

## 2020-09-19 DIAGNOSIS — M797 Fibromyalgia: Secondary | ICD-10-CM | POA: Insufficient documentation

## 2020-09-19 DIAGNOSIS — G309 Alzheimer's disease, unspecified: Secondary | ICD-10-CM | POA: Insufficient documentation

## 2020-09-19 DIAGNOSIS — M159 Polyosteoarthritis, unspecified: Secondary | ICD-10-CM | POA: Insufficient documentation

## 2020-09-28 ENCOUNTER — Ambulatory Visit: Payer: Medicare Other | Admitting: Neurology

## 2020-10-09 ENCOUNTER — Other Ambulatory Visit: Payer: Self-pay

## 2020-10-09 ENCOUNTER — Encounter: Payer: Self-pay | Admitting: Registered Nurse

## 2020-10-09 ENCOUNTER — Encounter: Payer: Medicare Other | Attending: Registered Nurse | Admitting: Registered Nurse

## 2020-10-09 VITALS — BP 153/68 | HR 72 | Temp 98.4°F | Ht <= 58 in | Wt 135.6 lb

## 2020-10-09 DIAGNOSIS — M25561 Pain in right knee: Secondary | ICD-10-CM | POA: Insufficient documentation

## 2020-10-09 DIAGNOSIS — M25511 Pain in right shoulder: Secondary | ICD-10-CM | POA: Diagnosis present

## 2020-10-09 DIAGNOSIS — M25512 Pain in left shoulder: Secondary | ICD-10-CM | POA: Insufficient documentation

## 2020-10-09 DIAGNOSIS — Z5181 Encounter for therapeutic drug level monitoring: Secondary | ICD-10-CM | POA: Insufficient documentation

## 2020-10-09 DIAGNOSIS — Z79899 Other long term (current) drug therapy: Secondary | ICD-10-CM | POA: Insufficient documentation

## 2020-10-09 DIAGNOSIS — G8929 Other chronic pain: Secondary | ICD-10-CM | POA: Insufficient documentation

## 2020-10-09 DIAGNOSIS — M25562 Pain in left knee: Secondary | ICD-10-CM | POA: Diagnosis present

## 2020-10-09 MED ORDER — TRAMADOL HCL 50 MG PO TABS: 50.0000 mg | ORAL_TABLET | Freq: Three times a day (TID) | ORAL | 5 refills | Status: DC | PRN

## 2020-10-09 NOTE — Progress Notes (Signed)
Subjective:    Patient ID: Lindsey Richardson, female    DOB: 1934/12/30, 86 y.o.   MRN: [id number]  HPI: Lindsey Richardson is a 86 y.o. female who returns for follow up appointment for chronic pain and medication refill. She states her pain is located in her bilateral shoulders L>R and bilateral knee pain. She rates her pain 7. Her current exercise regime is walking.   Lindsey Richardson was seen at [facility] on 08/16/2020, for Ocular cicatricial pemphigoid , note was reviewed. She has a F/U appointment with Ophthalmology.   Lindsey Richardson Morphine equivalent is 15.00 MME.   UDS ordered today.   Lindsey Richardson had a interpretor present for her visit, all questions answer via interpretor. Daughter in room.    Pain Inventory Average Pain 9 Pain Right Now 7 My pain is constant, sharp, and aching  In the last 24 hours, has pain interfered with the following? General activity 9 Relation with others 7 Enjoyment of life 8 What TIME of day is your pain at its worst? morning , daytime, evening, and night Sleep (in general) Good  Pain is worse with: walking, bending, sitting, inactivity, standing, and some activites Pain improves with: medication Relief from Meds: 6  No family history on file. Social History   Socioeconomic History   Marital status: Widowed    Spouse name: Not on file   Number of children: Not on file   Years of education: Not on file   Highest education level: Not on file  Occupational History   Not on file  Tobacco Use   Smoking status: Never   Smokeless tobacco: Never  Vaping Use   Vaping Use: Never used  Substance and Sexual Activity   Alcohol use: No   Drug use: No   Sexual activity: Not on file  Other Topics Concern   Not on file  Social History Narrative   Right handed   Lives with family    Social Determinants of Health   Financial Resource Strain: Not on file  Food Insecurity: Not on file  Transportation Needs: Not on file  Physical Activity: Not  on file  Stress: Not on file  Social Connections: Not on file   Past Surgical History:  Procedure Laterality Date   HERNIA REPAIR     REVERSE SHOULDER ARTHROPLASTY Right 01/01/2017   REVERSE SHOULDER ARTHROPLASTY Right 01/01/2017   Procedure: REVERSE SHOULDER ARTHROPLASTY;  Surgeon: Tania Ade, MD;  Location: [place];  Service: Orthopedics;  Laterality: Right;  Right reverse total shoulder arthroplasty   Past Surgical History:  Procedure Laterality Date   HERNIA REPAIR     REVERSE SHOULDER ARTHROPLASTY Right 01/01/2017   REVERSE SHOULDER ARTHROPLASTY Right 01/01/2017   Procedure: REVERSE SHOULDER ARTHROPLASTY;  Surgeon: Tania Ade, MD;  Location: [place];  Service: Orthopedics;  Laterality: Right;  Right reverse total shoulder arthroplasty   Past Medical History:  Diagnosis Date   Arthritis    GERD (gastroesophageal reflux disease)    pt denies   Hyperlipidemia    Migraine    Osteopenia    Osteoporosis    Vitamin D deficiency    There were no vitals taken for this visit.  Opioid Risk Score:   Fall Risk Score:  `1  Depression screen PHQ 2/9  Depression screen [facility] 2/9 04/10/2020 04/27/2018  Decreased Interest 1 1  Down, Depressed, Hopeless 1 1  PHQ - 2 Score 2 2  Altered sleeping - 1  Tired, decreased energy - 2  Change in  appetite - 1  Feeling bad or failure about yourself  - 3  Trouble concentrating - 2  Moving slowly or fidgety/restless - 2  Suicidal thoughts - 0  PHQ-9 Score - 13  Difficult doing work/chores - Very difficult    Review of Systems  Musculoskeletal:  Positive for arthralgias, back pain and gait problem.       Knee pain Pain all over  All other systems reviewed and are negative.     Objective:   Physical Exam Vitals and nursing note reviewed.  Constitutional:      Appearance: Normal appearance.  Cardiovascular:     Rate and Rhythm: Normal rate and regular rhythm.     Pulses: Normal pulses.     Heart sounds: Normal heart sounds.   Pulmonary:     Effort: Pulmonary effort is normal.     Breath sounds: Normal breath sounds.  Musculoskeletal:     Cervical back: Normal range of motion and neck supple.  Skin:    General: Skin is warm and dry.  Neurological:     Mental Status: She is alert and oriented to person, place, and time.  Psychiatric:        Mood and Affect: Mood normal.        Behavior: Behavior normal.         Assessment & Plan:  1. S/P Reverse Total Shoulder arthroplasty: S/P 01/01/2017. Continue HEP as Tolerated. Continue to Monitor. 10/09/2020. 2.  Lumbar Spondylosis: Continue HEP as Tolerated. Lindsey Richardson denies any pain at this time. 10/09/2020  3.Polyarthralgia: Continue to alternate with Heat and Ice Therapy. Continue to monitor. 10/09/2020 4. Chronic Pain Syndrome: Refilled: Tramadol 50 mg one tablet three times a day as needed for pain #90. We will continue the opioid monitoring program, this consists of regular clinic visits, examinations, urine drug screen, pill counts as well as use of [place] Controlled Substance Reporting system. A 12 month History has been reviewed on the [place] Controlled Substance Reporting System on 10/10/2020   5. Chronic Bilateral Knee Pain: Continue HEP as Tolerated. Continue current medication regimen. Continue to monitor.     F/U in 6 months.

## 2020-10-12 ENCOUNTER — Other Ambulatory Visit: Payer: Self-pay

## 2020-10-12 ENCOUNTER — Ambulatory Visit
Admission: RE | Admit: 2020-10-12 | Discharge: 2020-10-12 | Disposition: A | Discharge status: Home / Self Care | Payer: Medicare Other | Source: Ambulatory Visit | Attending: Physician Assistant | Admitting: Physician Assistant

## 2020-10-12 DIAGNOSIS — R413 Other amnesia: Secondary | ICD-10-CM

## 2020-10-13 LAB — TOXASSURE SELECT,+ANTIDEPR,UR

## 2020-10-15 NOTE — Progress Notes (Signed)
Patient advised.

## 2020-10-26 ENCOUNTER — Telehealth: Payer: Self-pay | Admitting: *Deleted

## 2020-10-26 NOTE — Telephone Encounter (Signed)
Urine drug screen for this encounter is consistent for prescribed medication 

## 2020-11-09 ENCOUNTER — Encounter: Payer: Self-pay | Admitting: Counselor

## 2021-01-07 ENCOUNTER — Telehealth: Payer: Self-pay | Admitting: Counselor

## 2021-01-07 NOTE — Telephone Encounter (Signed)
I reviewed this patients' chart in advance of their appointment on 01/09/2021 for neuropsychological evaluation. Unfortunately, I do not have the appropriate instrumentation or expertise to evaluate this patient in Arabic. With an MMSE of 7 she may be too severe to benefit from neuropsychological evaluation anyways. I am happy to see her for a clinical interview if that would be helpful to the family. I called to inform them of this and was unable to reach them. I left a message requesting that they call back.

## 2021-01-09 ENCOUNTER — Encounter: Payer: Medicare Other | Admitting: Counselor

## 2021-01-09 ENCOUNTER — Encounter: Payer: Self-pay | Admitting: Counselor

## 2021-01-09 DIAGNOSIS — Z029 Encounter for administrative examinations, unspecified: Secondary | ICD-10-CM

## 2021-01-16 ENCOUNTER — Encounter: Payer: Medicare Other | Admitting: Counselor

## 2021-01-28 ENCOUNTER — Ambulatory Visit: Payer: Medicare Other | Admitting: Physician Assistant

## 2021-03-24 ENCOUNTER — Emergency Department (HOSPITAL_BASED_OUTPATIENT_CLINIC_OR_DEPARTMENT_OTHER)
Admission: EM | Admit: 2021-03-24 | Discharge: 2021-03-24 | Disposition: A | Discharge status: Home / Self Care | Payer: Medicare Other | Attending: Emergency Medicine | Admitting: Emergency Medicine

## 2021-03-24 ENCOUNTER — Encounter (HOSPITAL_BASED_OUTPATIENT_CLINIC_OR_DEPARTMENT_OTHER): Payer: Self-pay | Admitting: Emergency Medicine

## 2021-03-24 ENCOUNTER — Other Ambulatory Visit: Payer: Self-pay

## 2021-03-24 ENCOUNTER — Emergency Department (HOSPITAL_BASED_OUTPATIENT_CLINIC_OR_DEPARTMENT_OTHER): Payer: Medicare Other

## 2021-03-24 DIAGNOSIS — R42 Dizziness and giddiness: Secondary | ICD-10-CM | POA: Diagnosis present

## 2021-03-24 DIAGNOSIS — H81399 Other peripheral vertigo, unspecified ear: Secondary | ICD-10-CM | POA: Insufficient documentation

## 2021-03-24 DIAGNOSIS — M542 Cervicalgia: Secondary | ICD-10-CM | POA: Insufficient documentation

## 2021-03-24 DIAGNOSIS — R1110 Vomiting, unspecified: Secondary | ICD-10-CM | POA: Insufficient documentation

## 2021-03-24 LAB — CBC
HCT: 39.1 % (ref 36.0–46.0)
Hemoglobin: 13 g/dL (ref 12.0–15.0)
MCH: 30 pg (ref 26.0–34.0)
MCHC: 33.2 g/dL (ref 30.0–36.0)
MCV: 90.1 fL (ref 80.0–100.0)
Platelets: 307 10*3/uL (ref 150–400)
RBC: 4.34 MIL/uL (ref 3.87–5.11)
RDW: 12.1 % (ref 11.5–15.5)
WBC: 7.1 10*3/uL (ref 4.0–10.5)
nRBC: 0 % (ref 0.0–0.2)

## 2021-03-24 LAB — COMPREHENSIVE METABOLIC PANEL
ALT: 19 U/L (ref 0–44)
AST: 24 U/L (ref 15–41)
Albumin: 4.4 g/dL (ref 3.5–5.0)
Alkaline Phosphatase: 56 U/L (ref 38–126)
Anion gap: 8 (ref 5–15)
BUN: 11 mg/dL (ref 8–23)
CO2: 26 mmol/L (ref 22–32)
Calcium: 9.4 mg/dL (ref 8.9–10.3)
Chloride: 103 mmol/L (ref 98–111)
Creatinine, Ser: 0.58 mg/dL (ref 0.44–1.00)
GFR, Estimated: >60 mL/min (ref >60)
Glucose, Bld: 117 mg/dL — ABNORMAL HIGH (ref 70–99)
Potassium: 4 mmol/L (ref 3.5–5.1)
Sodium: 137 mmol/L (ref 135–145)
Total Bilirubin: 0.6 mg/dL (ref 0.3–1.2)
Total Protein: 7.7 g/dL (ref 6.5–8.1)

## 2021-03-24 LAB — TROPONIN I (HIGH SENSITIVITY): Troponin I (High Sensitivity): 4 ng/L (ref <18)

## 2021-03-24 LAB — LIPASE, BLOOD: Lipase: 53 U/L — ABNORMAL HIGH (ref 11–51)

## 2021-03-24 MED ORDER — ONDANSETRON 4 MG PO TBDP
ORAL_TABLET | ORAL | Status: AC
  Administered 2021-03-24: 15:00:00 4 mg via ORAL
  Filled 2021-03-24: qty 1

## 2021-03-24 MED ORDER — LORAZEPAM 2 MG/ML IJ SOLN
0.5000 mg | Freq: Once | INTRAMUSCULAR | Status: AC
  Administered 2021-03-24: 16:00:00 0.5 mg via INTRAVENOUS
  Filled 2021-03-24: qty 1

## 2021-03-24 MED ORDER — ONDANSETRON 4 MG PO TBDP: 4.0000 mg | ORAL_TABLET | Freq: Once | ORAL | Status: AC

## 2021-03-24 MED ORDER — METOCLOPRAMIDE HCL 5 MG/ML IJ SOLN
10.0000 mg | Freq: Once | INTRAMUSCULAR | Status: AC
  Administered 2021-03-24: 16:00:00 10 mg via INTRAVENOUS
  Filled 2021-03-24: qty 2

## 2021-03-24 MED ORDER — IOHEXOL 350 MG/ML SOLN
100.0000 mL | Freq: Once | INTRAVENOUS | Status: AC | PRN
  Administered 2021-03-24: 16:00:00 100 mL via INTRAVENOUS

## 2021-03-24 MED ORDER — LORAZEPAM 1 MG PO TABS: 0.5000 mg | ORAL_TABLET | Freq: Three times a day (TID) | ORAL | 0 refills | Status: DC | PRN

## 2021-03-24 MED ORDER — LACTATED RINGERS IV BOLUS
1000.0000 mL | Freq: Once | INTRAVENOUS | Status: AC
  Administered 2021-03-24: 16:00:00 1000 mL via INTRAVENOUS

## 2021-03-24 NOTE — Discharge Instructions (Addendum)
All the blood tests and CAT scans look ok without signs of tumors or strokes.  The blood vessels look good.  She may have an inner ear problem called vertigo.  She can use the medication she was prescribed if she feels dizzy.  If she does not feel dizzy then she does not have to take the medication.  It can cause drowsiness if she takes it.  If she starts having trouble with walking, weakness on 1 side of her body, speech problems or starts vomitting and can't stop return to the ER.

## 2021-03-24 NOTE — ED Notes (Signed)
Spoke with Josh in lab to add on troponin

## 2021-03-24 NOTE — ED Triage Notes (Signed)
Emesis x 4 days , dizzy for 4 days . No fever .

## 2021-03-24 NOTE — ED Notes (Signed)
Pt sts feels better , had a soft drink , no co of nausea.

## 2021-03-24 NOTE — ED Provider Notes (Signed)
[facility] EMERGENCY DEPARTMENT Provider Note   CSN: [id number] Arrival date & time: 03/24/21  1505     History Chief Complaint  Patient presents with   Emesis    Lindsey Richardson is a 86 y.o. female.  Patient is an 86 year old female who is presenting today with her family with a complaint of 4 days of persistent dizziness and vomiting.  Patient describes that when she opens her eyes, moves her head, lays down or tries to walk everything is spinning in her vision and it makes her feel very sick and she vomits.  Daughter reports she has not been able to hold down any food or drinks because she continues to vomit.  This is never happened before.  Daughter reports she does have migraine headaches a lot but she has reported that the headache is worse and she is also having pain in her neck.  She has not had any cough, congestion, fever, chest pain or shortness of breath.  She has had no recent changes in her medications but she is immunocompromised and uses Humira regularly for her RA.  The history is provided by the patient and a caregiver. The history is limited by a language barrier. A language interpreter was used.  Emesis Severity:  Severe Duration:  4 days Timing:  Constant Number of daily episodes:  4-5 episodes per day Quality:  Undigested food and stomach contents Onset of vomiting after eating: shortly after trying to eat or drink. Progression:  Unchanged Chronicity:  New Associated symptoms: headaches   Associated symptoms: no abdominal pain, no arthralgias, no cough, no diarrhea, no fever, no myalgias, no sore throat and no URI   Risk factors comment:  Hx of arthritis on humera, migraines and hyperlipidemia     Past Medical History:  Diagnosis Date   Arthritis    GERD (gastroesophageal reflux disease)    pt denies   Hyperlipidemia    Migraine    Osteopenia    Osteoporosis    Vitamin D deficiency     Patient Active Problem List   Diagnosis Date Noted    Generalized osteoarthritis 09/19/2020   Fibromyalgia 09/19/2020   Rheumatoid arthritis ([place]) 09/19/2020   Mixed Alzheimer's and vascular dementia ([place]) 09/19/2020   DDD (degenerative disc disease), cervical 05/21/2018   Lumbar spondylosis with myelopathy 05/21/2018   S/P reverse total shoulder arthroplasty, right 01/01/2017   Mixed incontinence 06/11/2009   Esophageal reflux 06/01/2009    Past Surgical History:  Procedure Laterality Date   HERNIA REPAIR     REVERSE SHOULDER ARTHROPLASTY Right 01/01/2017   REVERSE SHOULDER ARTHROPLASTY Right 01/01/2017   Procedure: REVERSE SHOULDER ARTHROPLASTY;  Surgeon: Tania Ade, MD;  Location: [facility];  Service: Orthopedics;  Laterality: Right;  Right reverse total shoulder arthroplasty     OB History   No obstetric history on file.     No family history on file.  Social History   Tobacco Use   Smoking status: Never   Smokeless tobacco: Never  Vaping Use   Vaping Use: Never used  Substance Use Topics   Alcohol use: No   Drug use: No    Home Medications Prior to Admission medications   Medication Sig Start Date End Date Taking? Authorizing Provider  LORazepam (ATIVAN) 1 MG tablet Take 0.5 tablets (0.5 mg total) by mouth every 8 (eight) hours as needed (dizziness). 03/24/21  Yes Antoinett Dorman, Loree Fee, MD  cycloSPORINE (RESTASIS) 0.05 % ophthalmic emulsion 1 drop 2 (two) times daily. 10/04/20  [provider]  memantine (NAMENDA) 10 MG tablet Take 1 tablet (10 mg at night) for 2 weeks, then increase to 1 tablet (10 mg) twice a day 09/18/20   Rondel Jumbo, PA-C  neomycin-polymyxin b-dexamethasone (MAXITROL) 3.5-10000-0.1 OINT at bedtime. 08/15/20   [provider]  traMADol (ULTRAM) 50 MG tablet Take 1 tablet (50 mg total) by mouth 3 (three) times daily as needed. 10/09/20   Bayard Hugger, NP    Allergies    Pork-derived products  Review of Systems   Review of Systems  Constitutional:  Negative for fever.   HENT:  Negative for sore throat.   Respiratory:  Negative for cough.   Gastrointestinal:  Positive for vomiting. Negative for abdominal pain and diarrhea.  Musculoskeletal:  Negative for arthralgias and myalgias.  Neurological:  Positive for headaches.  All other systems reviewed and are negative.  Physical Exam Updated Vital Signs BP 100/67    Pulse 66    Temp 97.7 F (36.5 C) (Oral)    Resp 16    Wt 62.6 kg    SpO2 96%    BMI 28.84 kg/m   Physical Exam Vitals and nursing note reviewed.  Constitutional:      General: She is not in acute distress.    Appearance: Normal appearance. She is well-developed.  HENT:     Head: Normocephalic and atraumatic.     Right Ear: Tympanic membrane normal.     Left Ear: Tympanic membrane normal.     Mouth/Throat:     Mouth: Mucous membranes are dry.  Eyes:     Comments: Due to past surgery patient's eyelids are partially sewn shut so she is not able to open her eyes very widely.  Pupils are noted to be 2 mm and reactive.  Unable to tell if patient has nystagmus due to inability to hold her eyes open widely.  Neck:     Comments: Tenderness in the cervical paraspinal muscles bilaterally.  No adenopathy Cardiovascular:     Rate and Rhythm: Normal rate and regular rhythm.     Heart sounds: Normal heart sounds. No murmur heard.   No friction rub.  Pulmonary:     Effort: Pulmonary effort is normal.     Breath sounds: Normal breath sounds. No wheezing or rales.  Abdominal:     General: Bowel sounds are normal. There is no distension.     Palpations: Abdomen is soft.     Tenderness: There is no abdominal tenderness. There is no guarding or rebound.  Musculoskeletal:        General: Normal range of motion.     Cervical back: Tenderness present.     Comments: No edema  Skin:    General: Skin is warm and dry.     Findings: No rash.  Neurological:     General: No focal deficit present.     Mental Status: She is alert and oriented to person,  place, and time.     Cranial Nerves: No cranial nerve deficit.     Sensory: No sensory deficit.     Motor: No weakness.     Comments: Patient not ambulated at this time due to symptoms  Psychiatric:        Mood and Affect: Mood normal.        Behavior: Behavior normal.    ED Results / Procedures / Treatments   Labs (all labs ordered are listed, but only abnormal results are displayed) Labs Reviewed  LIPASE, BLOOD -  Abnormal; Notable for the following components:      Result Value   Lipase 53 (*)    All other components within normal limits  COMPREHENSIVE METABOLIC PANEL - Abnormal; Notable for the following components:   Glucose, Bld 117 (*)    All other components within normal limits  CBC  URINALYSIS, ROUTINE W REFLEX MICROSCOPIC  TROPONIN I (HIGH SENSITIVITY)  TROPONIN I (HIGH SENSITIVITY)    EKG EKG Interpretation  Date/Time:  Sunday March 24 2021 15:24:49 EST Ventricular Rate:  73 PR Interval:  154 QRS Duration: 80 QT Interval:  520 QTC Calculation: 574 R Axis:   32 Text Interpretation: Sinus rhythm Nonspecific T abnrm, anterolateral leads new Prolonged QT interval compared to prior tracing Confirmed by Blanchie Dessert [phone]) on 03/24/2021 4:10:09 PM  Radiology CT ANGIO HEAD NECK W WO CM  Result Date: 03/24/2021 CLINICAL DATA:  Dizziness, vertigo, headache and neck pain EXAM: CT ANGIOGRAPHY HEAD AND NECK TECHNIQUE: Multidetector CT imaging of the head and neck was performed using the standard protocol during bolus administration of intravenous contrast. Multiplanar CT image reconstructions and MIPs were obtained to evaluate the vascular anatomy. Carotid stenosis measurements (when applicable) are obtained utilizing NASCET criteria, using the distal internal carotid diameter as the denominator. CONTRAST:  162mL OMNIPAQUE IOHEXOL 350 MG/ML SOLN COMPARISON:  Brain MRI 10/12/2020 FINDINGS: CT HEAD FINDINGS Brain: There is no acute intracranial hemorrhage, extra-axial  fluid collection, or acute infarct. Parenchymal volume is within normal limits. The ventricles are normal in size. Confluent hypodensity in the subcortical and periventricular white matter likely reflects sequela of moderate chronic white matter microangiopathy. There is no solid mass lesion.  There is no midline shift. Vascular: See below Skull: Normal. Negative for fracture or focal lesion. Sinuses and orbits: Paranasal sinuses are clear. Bilateral lens implants are in place. The globes and orbits are otherwise unremarkable. Other: None. Review of the MIP images confirms the above findings CTA NECK FINDINGS Aortic arch: There is mild calcified atherosclerotic plaque in the aortic arch. There is no significant stenosis or dissection to the level imaged. The origins of the major branch vessels are patent. The bilateral subclavian arteries are patent. Right carotid system: There is a kink in the proximal right common carotid artery resulting in approximally 50% stenosis (10-79). The right common carotid artery is otherwise patent. There is minimal calcified atherosclerotic plaque in the right carotid bulb without hemodynamically significant stenosis or occlusion. There is no dissection or aneurysm. The right internal carotid artery is tortuous in the upper neck. Left carotid system: The left common, internal, and external carotid arteries are patent, without hemodynamically significant stenosis, occlusion, dissection, or aneurysm. The left internal carotid artery is tortuous in the upper neck. Vertebral arteries: There is compression of the left V2 segment at the C4-C5 level by prominent uncovertebral spurs resulting in approximally 50-60% stenosis (10-146, 12-112). The left vertebral artery is otherwise patent. The right vertebral artery is patent, without hemodynamically significant stenosis or occlusion. There is no dissection, or aneurysm. Skeleton: There is multilevel degenerative change of the cervical spine.  There is no acute osseous abnormality or aggressive osseous lesion. There is no visible canal hematoma. Other neck: The soft tissues are unremarkable. Upper chest: There is a 6 mm nodule in the right lung apex (10-42). A calcified granuloma is also noted in the right lung apex. Review of the MIP images confirms the above findings CTA HEAD FINDINGS Anterior circulation: The bilateral intracranial ICAs are patent. The bilateral MCAs are patent.  The bilateral ACAs are patent. The anterior communicating artery is patent. There is no aneurysm. Posterior circulation: Bilateral V4 segments are patent. The basilar artery is patent but relatively diminutive. The bilateral posterior cerebral arteries are patent common both fed by prominent posterior communicating arteries (fetal origins bilaterally). There is no aneurysm. Venous sinuses: Patent. Anatomic variants: As above. Review of the MIP images confirms the above findings IMPRESSION: 1. No acute intracranial pathology. 2. Moderate chronic white matter microangiopathy. 3. Kink in the proximal right common carotid artery resulting in up to approximately 50% stenosis. Otherwise, minimal calcified atherosclerotic plaque in the bilateral carotid bulbs without hemodynamically significant stenosis or occlusion. The high cervical internal carotid arteries are tortuous bilaterally which can be seen with hypertension. 4. Compression of the left V2 segment at the C4-C5 level by prominent uncovertebral spurs resulting in approximately 50-60% stenosis. Otherwise, patent vertebral arteries. 5. Patent intracranial vasculature. 6. 6 mm nodule in the right lung apex. Recommend a non-contrast Chest CT at 6-12 months. If patient is high risk for malignancy, recommend an additional non-contrast Chest CT at 18-24 months; if patient is low risk for malignancy a non-contrast Chest CT at 18-24 months is optional. These guidelines do not apply to immunocompromised patients and patients with  cancer. Follow up in patients with significant comorbidities as clinically warranted. For lung cancer screening, adhere to Lung-RADS guidelines. Reference: Radiology. 2017; 284(1):228-43. Electronically Signed   By: Valetta Mole M.D.   On: 03/24/2021 17:02    Procedures Procedures   Medications Ordered in ED Medications  ondansetron (ZOFRAN-ODT) disintegrating tablet 4 mg (4 mg Oral Given 03/24/21 1522)  lactated ringers bolus 1,000 mL ( Intravenous Stopped 03/24/21 1709)  LORazepam (ATIVAN) injection 0.5 mg (0.5 mg Intravenous Given 03/24/21 1549)  metoCLOPramide (REGLAN) injection 10 mg (10 mg Intravenous Given 03/24/21 1551)  iohexol (OMNIPAQUE) 350 MG/ML injection 100 mL (100 mLs Intravenous Contrast Given 03/24/21 1619)    ED Course  I have reviewed the triage vital signs and the nursing notes.  Pertinent labs & imaging results that were available during my care of the patient were reviewed by me and considered in my medical decision making (see chart for details).    MDM Rules/Calculators/A&P                         Patient is an elderly female presenting today with complaints of vertigo and vomiting.  Symptoms are reproduced with any type of head movement, standing or lying down.  Due to past surgeries it is difficult to tell if the patient has no nystagmus but her symptoms are reproduced when she moves her eyes to the right and left on the exam.  She is awake and alert and able to complete the neurologic exam lying in the bed without difficulty.  She does have a history of migraine headaches but is calm planing of more pain and neck pain since her symptoms started.  Concern for peripheral vertigo versus posterior stroke versus vertebral artery dissection versus atypical migraine.  Low suspicion for an acute bleed today such as subarachnoid hemorrhage or concern for venous thrombosis.  Also low suspicion for infectious etiology as patient has not had any infectious symptoms.  Patient  given IV fluids, Reglan for migraine and 0.5 mg of Ativan for the dizziness.  Labs and imaging are pending.  Will reevaluate.  6:11 PM Pt's labs are reassuring with neg trop and stable cr.  CTA of head and neck neg  for acute pathology.  Some areas that are tortuous with stenosis of 50% but no critical occlusion or dissection.  Pt has been sleeping after meds.  Will po challenge and see if pt is able to walk without being dizzy.  6:11 PM Pt reports symptoms are gone.  She is able to sit up in the bed and walk without difficulty.  Will treat for peripheral vertigo but given strict return precautions.  MDM   Amount and/or Complexity of Data Reviewed Clinical lab tests: ordered and reviewed Tests in the radiology section of CPT: ordered and reviewed Tests in the medicine section of CPT: ordered and reviewed Independent visualization of images, tracings, or specimens: yes        Final Clinical Impression(s) / ED Diagnoses Final diagnoses:  Peripheral vertigo, unspecified laterality    Rx / DC Orders ED Discharge Orders          Ordered    LORazepam (ATIVAN) 1 MG tablet  Every 8 hours PRN        03/24/21 1809             Blanchie Dessert, MD 03/24/21 1812

## 2021-03-25 ENCOUNTER — Telehealth (HOSPITAL_BASED_OUTPATIENT_CLINIC_OR_DEPARTMENT_OTHER): Payer: Self-pay | Admitting: Emergency Medicine

## 2021-03-25 MED ORDER — LORAZEPAM 1 MG PO TABS: 0.5000 mg | ORAL_TABLET | Freq: Three times a day (TID) | ORAL | 0 refills | Status: AC | PRN

## 2021-03-25 NOTE — Telephone Encounter (Signed)
Sent over prescription to pharmacy again

## 2021-04-03 ENCOUNTER — Other Ambulatory Visit: Payer: Self-pay | Admitting: Registered Nurse

## 2021-04-04 NOTE — Telephone Encounter (Signed)
PMP was Reviewed.  Pharmacy was called, they needed new prescription.  Tramadol e-scribed today

## 2021-04-04 NOTE — Telephone Encounter (Signed)
PMP report:  Filled  Written  ID  Drug  QTY  Days  Prescriber  RX #  Dispenser  Refill  Daily Dose*  Pymt Type  PMP  03/25/2021 03/25/2021 2  Lorazepam 1 Mg Tablet 10.00 6 Wh Plu [id number] [facility] ([id number] 0/0 1.67 LME Medicare [place] 03/06/2021 10/09/2020 2  Tramadol Hcl 50 Mg Tablet 90.00 30 Eu Tho [id number] [facility] ([id number]) 5/5 15.00 MME Medicare   Last office visit 10/09/2020, Next office visit 04/09/2021

## 2021-04-09 ENCOUNTER — Encounter: Payer: Self-pay | Admitting: Registered Nurse

## 2021-04-09 ENCOUNTER — Encounter: Payer: Medicare Other | Attending: Registered Nurse | Admitting: Registered Nurse

## 2021-04-09 ENCOUNTER — Other Ambulatory Visit: Payer: Self-pay

## 2021-04-09 VITALS — BP 105/65 | HR 78 | Temp 98.5°F | Ht <= 58 in | Wt 137.0 lb

## 2021-04-09 DIAGNOSIS — M25511 Pain in right shoulder: Secondary | ICD-10-CM | POA: Insufficient documentation

## 2021-04-09 DIAGNOSIS — M5030 Other cervical disc degeneration, unspecified cervical region: Secondary | ICD-10-CM | POA: Diagnosis present

## 2021-04-09 DIAGNOSIS — M2550 Pain in unspecified joint: Secondary | ICD-10-CM

## 2021-04-09 DIAGNOSIS — M25561 Pain in right knee: Secondary | ICD-10-CM | POA: Diagnosis present

## 2021-04-09 DIAGNOSIS — Z5181 Encounter for therapeutic drug level monitoring: Secondary | ICD-10-CM

## 2021-04-09 DIAGNOSIS — Z79899 Other long term (current) drug therapy: Secondary | ICD-10-CM

## 2021-04-09 DIAGNOSIS — M4716 Other spondylosis with myelopathy, lumbar region: Secondary | ICD-10-CM

## 2021-04-09 DIAGNOSIS — M25562 Pain in left knee: Secondary | ICD-10-CM | POA: Diagnosis present

## 2021-04-09 DIAGNOSIS — M7061 Trochanteric bursitis, right hip: Secondary | ICD-10-CM | POA: Diagnosis present

## 2021-04-09 DIAGNOSIS — M7062 Trochanteric bursitis, left hip: Secondary | ICD-10-CM | POA: Diagnosis present

## 2021-04-09 DIAGNOSIS — M25512 Pain in left shoulder: Secondary | ICD-10-CM | POA: Insufficient documentation

## 2021-04-09 DIAGNOSIS — G8929 Other chronic pain: Secondary | ICD-10-CM | POA: Diagnosis present

## 2021-04-09 MED ORDER — TRAMADOL HCL 50 MG PO TABS: ORAL_TABLET | ORAL | 5 refills | Status: AC

## 2021-04-09 NOTE — Progress Notes (Signed)
Subjective:    Patient ID: Lindsey Richardson, female    DOB: June 02, 1934, 87 y.o.   MRN: [id number]  HPI: Lindsey Richardson is a 87 y.o. female who returns for follow up appointment for chronic pain and medication refill. She points to her pain which  is located in her bilateral shoulders, lower back and bilateral hips. She rated her pain onj the health and history 0.Her  current exercise regime is walking short distances.   Ms. Warrick Morphine equivalent is 15.00 MME.   Last UDS was Performed on 10/09/2020, it was consistent.      Pain Inventory Average Pain 8 Pain Right Now 0 My pain is intermittent and aching  In the last 24 hours, has pain interfered with the following? General activity 9 Relation with others 9 Enjoyment of life 9 What TIME of day is your pain at its worst? morning  Sleep (in general) Fair  Pain is worse with: walking Pain improves with: rest and medication Relief from Meds: 10  History reviewed. No pertinent family history. Social History   Socioeconomic History   Marital status: Widowed    Spouse name: Not on file   Number of children: Not on file   Years of education: Not on file   Highest education level: Not on file  Occupational History   Not on file  Tobacco Use   Smoking status: Never   Smokeless tobacco: Never  Vaping Use   Vaping Use: Never used  Substance and Sexual Activity   Alcohol use: No   Drug use: No   Sexual activity: Not on file  Other Topics Concern   Not on file  Social History Narrative   Right handed   Lives with family    Social Determinants of Health   Financial Resource Strain: Not on file  Food Insecurity: Not on file  Transportation Needs: Not on file  Physical Activity: Not on file  Stress: Not on file  Social Connections: Not on file   Past Surgical History:  Procedure Laterality Date   HERNIA REPAIR     REVERSE SHOULDER ARTHROPLASTY Right 01/01/2017   REVERSE SHOULDER ARTHROPLASTY Right 01/01/2017    Procedure: REVERSE SHOULDER ARTHROPLASTY;  Surgeon: Tania Ade, MD;  Location: [facility];  Service: Orthopedics;  Laterality: Right;  Right reverse total shoulder arthroplasty   Past Surgical History:  Procedure Laterality Date   HERNIA REPAIR     REVERSE SHOULDER ARTHROPLASTY Right 01/01/2017   REVERSE SHOULDER ARTHROPLASTY Right 01/01/2017   Procedure: REVERSE SHOULDER ARTHROPLASTY;  Surgeon: Tania Ade, MD;  Location: [facility];  Service: Orthopedics;  Laterality: Right;  Right reverse total shoulder arthroplasty   Past Medical History:  Diagnosis Date   Arthritis    GERD (gastroesophageal reflux disease)    pt denies   Hyperlipidemia    Migraine    Osteopenia    Osteoporosis    Vitamin D deficiency    BP 105/65    Pulse 78    Temp 98.5 F (36.9 C)    Ht 4\' 10"  (1.473 m)    Wt 137 lb (62.1 kg)    SpO2 96%    BMI 28.63 kg/m   Opioid Risk Score:   Fall Risk Score:  `1  Depression screen PHQ 2/9  Depression screen [facility] 2/9 10/09/2020 04/10/2020 04/27/2018  Decreased Interest 1 1 1   Down, Depressed, Hopeless 1 1 1   PHQ - 2 Score 2 2 2   Altered sleeping - - 1  Tired, decreased energy - -  2  Change in appetite - - 1  Feeling bad or failure about yourself  - - 3  Trouble concentrating - - 2  Moving slowly or fidgety/restless - - 2  Suicidal thoughts - - 0  PHQ-9 Score - - 13  Difficult doing work/chores - - Very difficult    Review of Systems  Musculoskeletal:  Positive for arthralgias.  All other systems reviewed and are negative.     Objective:   Physical Exam Vitals and nursing note reviewed.  Constitutional:      Appearance: Normal appearance.  Cardiovascular:     Rate and Rhythm: Normal rate and regular rhythm.     Pulses: Normal pulses.     Heart sounds: Normal heart sounds.  Pulmonary:     Effort: Pulmonary effort is normal.     Breath sounds: Normal breath sounds.  Musculoskeletal:     Cervical back: Normal range of motion and neck supple.     Comments:  Normal Muscle Bulk and Muscle Testing Reveals:  Upper Extremities:Right: Decreased  ROM 30 Degrees and Muscle Strength 5/5 Left Upper Extremity: Full ROM and Muscle Strength 5/5 Bilateral AC Joint Tenderness Lumbar Paraspinal Tenderness: L-3-L-5 Lower Extremities: Full ROM and Muscle Strength 5/5 Arises from Table Slowly Narrow Based  Gait     Skin:    General: Skin is warm and dry.  Neurological:     Mental Status: She is alert and oriented to person, place, and time.  Psychiatric:        Mood and Affect: Mood normal.        Behavior: Behavior normal.         Assessment & Plan:  1. S/P Reverse Total Shoulder arthroplasty: S/P 01/01/2017. Continue HEP as Tolerated. Continue to Monitor. 04/09/2021. 2.  Lumbar Spondylosis: Continue HEP as Tolerated. Continue to Monitor. 04/09/2021  3.Polyarthralgia: Continue to alternate with Heat and Ice Therapy. Continue to monitor. 04/09/2021 4. Chronic Pain Syndrome: Refilled: Tramadol 50 mg one tablet three times a day as needed for pain #90. We will continue the opioid monitoring program, this consists of regular clinic visits, examinations, urine drug screen, pill counts as well as use of [place] Controlled Substance Reporting system. A 12 month History has been reviewed on the [place] Controlled Substance Reporting System on 04/09/2021  5. Chronic Bilateral Knee Pain: No Complaints Today. Continue HEP as Tolerated. Continue current medication regimen.    F/U in 6 months.

## 2021-04-10 ENCOUNTER — Encounter: Payer: Self-pay | Admitting: Registered Nurse

## 2021-05-03 ENCOUNTER — Other Ambulatory Visit: Payer: Self-pay | Admitting: Registered Nurse

## 2021-05-03 NOTE — Telephone Encounter (Signed)
Lindsey Richardson called the [facility] at [name] and the prescription was sent there and not to the one on [address]. I called patient and she stated it is supposed to be at [name].

## 2021-05-15 DIAGNOSIS — Z0279 Encounter for issue of other medical certificate: Secondary | ICD-10-CM

## 2021-08-01 ENCOUNTER — Telehealth: Payer: Self-pay | Admitting: Physician Assistant

## 2021-08-01 DIAGNOSIS — Z0279 Encounter for issue of other medical certificate: Secondary | ICD-10-CM

## 2021-08-01 NOTE — Telephone Encounter (Signed)
Patient's son Robbi Garter came by the office and scheduled follow up for the patient 08/02/21 with Sharene Butters, PA. ? ?Patient's follow up 01/28/21 was cancelled by the provider since she did not complete recommended neuropsychological testing. However, patient is still needing care. ? ?Her son explained his sister, the usual caregiver, has been out of the country for the last couple of months. ? ?In the meantime, the responsibility for her care has fallen to him and as a result, his job has been effected and is in jeopardy. ? ?He dropped off and paid for FMLA forms that he is needing completed as soon as possible. ?

## 2021-08-02 ENCOUNTER — Encounter: Payer: Self-pay | Admitting: Physician Assistant

## 2021-08-02 ENCOUNTER — Ambulatory Visit (INDEPENDENT_AMBULATORY_CARE_PROVIDER_SITE_OTHER): Payer: Medicare Other | Admitting: Physician Assistant

## 2021-08-02 VITALS — BP 130/74 | HR 77 | Ht <= 58 in | Wt 132.0 lb

## 2021-08-02 DIAGNOSIS — F0280 Dementia in other diseases classified elsewhere without behavioral disturbance: Secondary | ICD-10-CM | POA: Diagnosis not present

## 2021-08-02 DIAGNOSIS — F0150 Vascular dementia without behavioral disturbance: Secondary | ICD-10-CM | POA: Diagnosis not present

## 2021-08-02 DIAGNOSIS — G309 Alzheimer's disease, unspecified: Secondary | ICD-10-CM

## 2021-08-02 DIAGNOSIS — F02818 Dementia in other diseases classified elsewhere, unspecified severity, with other behavioral disturbance: Secondary | ICD-10-CM | POA: Diagnosis not present

## 2021-08-02 MED ORDER — MEMANTINE HCL 10 MG PO TABS: ORAL_TABLET | ORAL | 11 refills | Status: AC

## 2021-08-02 NOTE — Patient Instructions (Addendum)
It was a pleasure to see you today at our office.  ? ?Recommendations: ? ?Meds: ?Follow up in  6 months Nov 3 at 11:30  ?Referral to Social work for caregiver stress and other questions ?  Start Memantine '10mg'$  tablets.  Take 1 tablet at bedtime for 2 weeks, then 1 tablet twice daily.    Call if severe symptoms appear  ? ?Feel free to visit Facebook page " [employer]" for tips of how to care for people with memory problems.  ? ? ?RECOMMENDATIONS FOR ALL PATIENTS WITH MEMORY PROBLEMS: ?1. Continue to exercise (Recommend 30 minutes of walking everyday, or 3 hours every week) ?2. Increase social interactions - continue going to [place] and enjoy social gatherings with friends and family ?3. Eat healthy, avoid fried foods and eat more fruits and vegetables ?4. Maintain adequate blood pressure, blood sugar, and blood cholesterol level. Reducing the risk of stroke and cardiovascular disease also helps promoting better memory. ?5. Avoid stressful situations. Live a simple life and avoid aggravations. Organize your time and prepare for the next day in anticipation. ?6. Sleep well, avoid any interruptions of sleep and avoid any distractions in the bedroom that may interfere with adequate sleep quality ?7. Avoid sugar, avoid sweets as there is a strong link between excessive sugar intake, diabetes, and cognitive impairment ?We discussed the Mediterranean diet, which has been shown to help patients reduce the risk of progressive memory disorders and reduces cardiovascular risk. This includes eating fish, eat fruits and green leafy vegetables, nuts like almonds and hazelnuts, walnuts, and also use olive oil. Avoid fast foods and fried foods as much as possible. Avoid sweets and sugar as sugar use has been linked to worsening of memory function. ? ?There is always a concern of gradual progression of memory problems. If this is the case, then we may need to adjust level of care according to patient needs. Support, both to the patient  and caregiver, should then be put into place.  ? ? ?The Alzheimer?s Association is here all day, every day for people facing Alzheimer?s disease through our free 24/7 Helpline: [phone]. The Helpline provides reliable information and support to all those who need assistance, such as individuals living with memory loss, Alzheimer's or other dementia, caregivers, health care professionals and the public.  ?Our highly trained and knowledgeable staff can help you with: ?Understanding memory loss, dementia and Alzheimer's  ?Medications and other treatment options  ?General information about aging and brain health  ?Skills to provide quality care and to find the best care from professionals  ?Legal, financial and living-arrangement decisions ?Our Helpline also features: ?Confidential care consultation provided by master's level clinicians who can help with decision-making support, crisis assistance and education on issues families face every day  ?Help in a caller's preferred language using our translation service that features more than 200 languages and dialects  ?Referrals to local community programs, services and ongoing support ? ? ? ? ?FALL PRECAUTIONS: Be cautious when walking. Scan the area for obstacles that may increase the risk of trips and falls. When getting up in the mornings, sit up at the edge of the bed for a few minutes before getting out of bed. Consider elevating the bed at the head end to avoid drop of blood pressure when getting up. Walk always in a well-lit room (use night lights in the walls). Avoid area rugs or power cords from appliances in the middle of the walkways. Use a walker or a cane if necessary  and consider physical therapy for balance exercise. Get your eyesight checked regularly. ? ?FINANCIAL OVERSIGHT: Supervision, especially oversight when making financial decisions or transactions is also recommended. ? ?HOME SAFETY: Consider the safety of the kitchen when operating appliances  like stoves, microwave oven, and blender. Consider having supervision and share cooking responsibilities until no longer able to participate in those. Accidents with firearms and other hazards in the house should be identified and addressed as well. ? ? ?ABILITY TO BE LEFT ALONE: If patient is unable to contact 911 operator, consider using LifeLine, or when the need is there, arrange for someone to stay with patients. Smoking is a fire hazard, consider supervision or cessation. Risk of wandering should be assessed by caregiver and if detected at any point, supervision and safe proof recommendations should be instituted. ? ?MEDICATION SUPERVISION: Inability to self-administer medication needs to be constantly addressed. Implement a mechanism to ensure safe administration of the medications. ? ? ?DRIVING: Regarding driving, in patients with progressive memory problems, driving will be impaired. We advise to have someone else do the driving if trouble finding directions or if minor accidents are reported. Independent driving assessment is available to determine safety of driving. ? ? ?If you are interested in the driving assessment, you can contact the following: ? ?[employer] in [place] ? ?[place] [phone] ? ?[facility] [phone] ? ?[facility] [phone] or [phone] ? ?  ? ? ?Mediterranean Diet ?A Mediterranean diet refers to food and lifestyle choices that are based on the traditions of countries located on the [employer]. This way of eating has been shown to help prevent certain conditions and improve outcomes for people who have chronic diseases, like kidney disease and heart disease. ?What are tips for following this plan? ?Lifestyle  ?Cook and eat meals together with your family, when possible. ?Drink enough fluid to keep your urine clear or pale yellow. ?Be physically active every day. This includes: ?Aerobic exercise like  running or swimming. ?Leisure activities like gardening, walking, or housework. ?Get 7-8 hours of sleep each night. ?If recommended by your health care provider, drink red wine in moderation. This means 1 glass a day for nonpregnant women and 2 glasses a day for men. A glass of wine equals 5 oz (150 mL). ?Reading food labels  ?Check the serving size of packaged foods. For foods such as rice and pasta, the serving size refers to the amount of cooked product, not dry. ?Check the total fat in packaged foods. Avoid foods that have saturated fat or trans fats. ?Check the ingredients list for added sugars, such as corn syrup. ?Shopping  ?At the grocery store, buy most of your food from the areas near the walls of the store. This includes: ?Fresh fruits and vegetables (produce). ?Grains, beans, nuts, and seeds. Some of these may be available in unpackaged forms or large amounts (in bulk). ?Fresh seafood. ?Poultry and eggs. ?Low-fat dairy products. ?Buy whole ingredients instead of prepackaged foods. ?Buy fresh fruits and vegetables in-season from local farmers markets. ?Buy frozen fruits and vegetables in resealable bags. ?If you do not have access to quality fresh seafood, buy precooked frozen shrimp or canned fish, such as tuna, salmon, or sardines. ?Buy small amounts of raw or cooked vegetables, salads, or olives from the deli or salad bar at your store. ?Stock your pantry so you always have certain foods on hand, such as olive oil, canned tuna, canned tomatoes, rice, pasta, and beans. ?Cooking  ?Lacinda Axon  foods with extra-virgin olive oil instead of using butter or other vegetable oils. ?Have meat as a side dish, and have vegetables or grains as your main dish. This means having meat in small portions or adding small amounts of meat to foods like pasta or stew. ?Use beans or vegetables instead of meat in common dishes like chili or lasagna. ?Experiment with different cooking methods. Try roasting or broiling vegetables  instead of steaming or saut?eing them. ?Add frozen vegetables to soups, stews, pasta, or rice. ?Add nuts or seeds for added healthy fat at each meal. You can add these to yogurt, salads, or vegetable dishes.

## 2021-08-02 NOTE — Progress Notes (Addendum)
? ?Assessment/Plan:  ? ?87 y.o. year old female with risk factors including age, polyarthralgia, rheumatoid arthritis, hyperlipidemia, migraines [facility] as [facility], remote brain injury, seen today for evaluation of memory loss. MMSE was 7/30. MRI brain 10/2020 with Mild-to-moderate chronic small vessel ischemic changes within the cerebral white matter, and Moderate generalized cerebral atrophy with comparatively mild cerebellar atrophy,which also progressed. ? She was prescribed Memantine 10 mg bid, but she has not been taking it, in view of family dynamics. Daughter is out of state and no longer caring for her. Her son and her niece are now in charge, and would like to resume memantine. Her cognitive status is about the same, but given advanced disease, it is unlikely that memantine is to be therapeutic.  Her family would like to try the medication and make the decision to discontinue it themselves.  ? ? Late onset Mixed Dementia, severe likely due to Alzheimer's Disease and Vascular  ? ? Recommendations:  ? ?Discussed safety both in and out of the home.  ?Discussed the importance of regular daily schedule with inclusion of crossword puzzles to maintain brain function.  ?Continue to monitor mood by PCP ?Stay active at least 30 minutes at least 3 times a week.  ?Naps should be scheduled and should be no longer than 60 minutes and should not occur after 2 PM.  ?Mediterranean diet is recommended  ?Control cardiovascular risk factors  ?Resume memantine 10 mg bid Side effects were discussed ?Son to have FMLA papers filled out so that he can provide the proper care for the patient and be able to take her to the pertinent appointments.  ?Follow up in  6 months. ? ? ?Case discussed with Dr. Tomi Likens  who agrees with the plan ? ? ? ? ?Subjective:  ? ? ?Lindsey Richardson is a very pleasant 87 y.o. RH female  seen today in follow up for memory loss. This patient is accompanied in the office by  her  56 year old niece who supplements the history.  Previous records as well as any outside records available were reviewed prior to todays visit.  Patient was last seen at our office on 10/2020  at which time her MMSE was 7/30. Since that time, there were significant changes in family dynamics. Per niece's report, her daughter moved out of state and discontinued her care. Her son and her 33 yo niece are now participating fully in her care and some of her behavior has improved, but cognitively,  ?She was prescribed memantine 10 mg bid, but has not been taking it.  ? ?Any changes in memory since last visit? Since moving with her niece, it is stable, It is worse if she is stressed. She has some episodes in which her LTM is more accurate, but STM continues to be  of problem  ? Patient lives with: Spouse who noticed changes as well.  Patient lives alone ?repeats oneself?  Endorsed: frequently asks where her house is. At the office she continues to repeat that she wants her vision checked, she believes is at the eye doctor, per niece's reports. ?Disoriented when walking into a room?  ?Leaving objects in unusual places?  Endorsed. She "moves things around, especially clothes "  ?Ambulates  with difficulty?   Endorsed as she is blind ?Recent falls?  Patient denies   ?Any head injuries?  Patient denies   ?History of seizures?   Patient denies   ?Wandering behavior?  Patient denies   ?  Patient drives?   Patient no longer drives   ?Any mood changes such irritability agitation?  Endorsed, when she cannot find what she is looking for, and begins to open and close doors when anxious .  ?Any history of depression?:  Patient denies   ?Hallucinations?  Endorsed, but less frequent than before. SHe  talks to people that they are not in the room, especially at night".  She frequently talks to her dead husband (he died 45 years ago).  ?Paranoia? Endorsed.  Occasionally screams at night  but denies sleepwalking. ?History of sleep apnea?   Patient denies   ?Any hygiene concerns?  Patient denies  , resistant to shower "because of her vision", Her niece helps her, but patient only allows showering once a week.  ?Independent of bathing and dressing?  Endorsed  ?Does the patient needs help with medications?  Endorsed niece helps    ?Who is in charge of the finances?  Patient's son is in charge    ?Any changes in appetite?  Patient denies but family reports that she only likes fruits "it's a challenge"    ?Patient have trouble swallowing? Patient denies   ?Does the patient cook?  Patient denies   ?Any kitchen accidents such as leaving the stove on? Patient denies   ?Any headaches?  Patient denies   ?She has a history of falls in 2019 with head injury.  The patient has a history of migraine headaches for at least a year, with a frequency of weekly,  ?accompanied by photophobia.  She has to lay down, until these subside.  Sometimes she takes Tylenol, and at times she takes Advil. The double vision? Patient denies  She denies any double vision, but she has ocular cicatricial pemphigoid and dry eyes treated by ophthalmologist at [facility].  ?Any focal numbness or tingling?  Patient denies   ?Chronic back pain Patient denies   ?Unilateral weakness?  Patient denies   ?Any tremors?  Patient denies   ?Any history of anosmia?  Patient denies   ?Any incontinence of urine?  Denies  ?Any bowel dysfunction?   Constipation, not drinking enough water  ? ? ?  ? Initial Visit 09/18/20 The patient is seen in neurologic consultation at the request of [name], MD for the evaluation of memory.  The patient is accompanied by daughter who supplements the history.Patient wants daughter only as an interpreter and waiver for declining these services has been signed. The majority of the information is obtained from her daughter due to the patient's dementia. ? The patient is a very pleasant 87 y.o. year old [place] speaker [name]  female who has had memory issues for the last 3  years, more noticeable over the last 12 months.  "Starting with forgetting what she was doing when entering the room, or looking for her lunch, when the food was in front of her ".  When asked the patient how her memory is, she reports "excellent ". The patient has been denying her symptoms "forever".  Sometimes, she begins to cry for no reason.  "She also talks to people that they are not in the room, especially at night".  She frequently talks to her dead husband (he died 16 years ago).  She also has conversations with people who are alive but they are out of state. At times, she reports animals in the room. She goes to sleep at midnight, and she wakes up around 1pm, but that " has been always have a pattern".  She takes occasionally a nap. When sleeping, daughter hears her screaming at times, but denies sleepwalking. ?The patient lives with her daughter and her daughter's family, and her mood is mostly good, without irritability. Other than the hallucinations mentioned above, there is no apparent paranoia.  The patient dresses up and bathes without help. Daughter is in charge of the medications, finances and driving.  She cooks with assistance, has not been leaving the stove or the faucet on.  Denies leaving objects in unusual places.  She ambulates without difficulty without a walker or a cane.  She used to spend her time visiting and staying with her other 2 children, one in the [place], and one in [place] until moving with her.  ?  ?She has a history of falls especially in 2019 with head injury.  The patient has a history of migraine headaches for at least a year, with a frequency of 3 times a week, accompanied by photophobia.  She has to lay down, until these subside.  Sometimes she takes Tylenol, and at times she takes Advil.  Per chart note, the patient is being seen at the headache clinic, although no notes are available for review.  She denies any double vision, but she has ocular cicatricial pemphigoid  and dry eyes treated by ophthalmologist at [facility].  Denies vertigo, dizziness, focal numbness or tingling, unilateral weakness or tremors. She has chronic urine incontinence and uses diapers. The patient has a

## 2021-08-03 DIAGNOSIS — F02818 Dementia in other diseases classified elsewhere, unspecified severity, with other behavioral disturbance: Secondary | ICD-10-CM | POA: Insufficient documentation

## 2021-08-03 DIAGNOSIS — G309 Alzheimer's disease, unspecified: Secondary | ICD-10-CM | POA: Insufficient documentation

## 2021-08-06 ENCOUNTER — Telehealth: Payer: Self-pay

## 2021-08-06 NOTE — Telephone Encounter (Signed)
Completed fmla papers, copy to scan and copy to patient in the mail  ?

## 2021-08-06 NOTE — Telephone Encounter (Signed)
Mailed FMLA papers out today, copy to scan.  ?

## 2021-08-13 ENCOUNTER — Telehealth: Payer: Self-pay | Admitting: Physician Assistant

## 2021-08-13 NOTE — Telephone Encounter (Signed)
Fmla paper is in chart, left message if it needs to be continued he will have to arrange another fax copy to come.  ?

## 2021-08-13 NOTE — Telephone Encounter (Signed)
The following message was left with [employer] on 08/13/21 at 12:45 PM. ? ?Caller states he is calling to check on the status of a leave document that was requested and already paid for last month. ?

## 2021-09-09 ENCOUNTER — Telehealth: Payer: Self-pay | Admitting: Physician Assistant

## 2021-09-09 NOTE — Telephone Encounter (Signed)
Cyndee Brightly called and stated she wanted to discuss stopping the medication mamantine.  She stated at the last visit they discussed that if it wasn't helping they may want to stop it.  It is causing constipation

## 2021-09-10 NOTE — Telephone Encounter (Signed)
Patient advised to stop the medication.

## 2021-09-10 NOTE — Telephone Encounter (Signed)
Ok to stop, thanks

## 2021-09-24 ENCOUNTER — Telehealth: Payer: Self-pay

## 2021-09-24 NOTE — Telephone Encounter (Signed)
Patient's son called and wanted to know what her follow up appointment on 10/07/21 is about. Informed him that it is a follow up on her pain. He states he wants her completely on off the Tramadol. He also wants to discuss her dementia, insomnia and anxiety. He states she get so agitated that she does not want to get out of bed sometimes. He states it has been a while since she has seen her PCP, due to her agitation. He stated she needs something for her sleep and anxiety due to her dementia

## 2021-09-25 NOTE — Telephone Encounter (Signed)
Spoke with Zella Ball and she stated he would prefer the son wean the patient down from the Tramadol, 2 times a day for 7-10 days then once a day for 7-10 days. She stated the dementia needs to be handled by PCP and a neurologist. If she doesn't see a neurologist, PCP will have to refer her to one. Zella Ball is unable to handle that diagnosis

## 2021-09-26 ENCOUNTER — Ambulatory Visit: Payer: Medicare Other | Admitting: Registered Nurse

## 2021-10-07 ENCOUNTER — Telehealth: Payer: Self-pay

## 2021-10-07 ENCOUNTER — Encounter: Payer: Self-pay | Admitting: Registered Nurse

## 2021-10-07 ENCOUNTER — Encounter: Payer: Medicare Other | Attending: Registered Nurse | Admitting: Registered Nurse

## 2021-10-07 VITALS — BP 107/69 | HR 66 | Ht <= 58 in | Wt 135.0 lb

## 2021-10-07 DIAGNOSIS — Z5181 Encounter for therapeutic drug level monitoring: Secondary | ICD-10-CM

## 2021-10-07 DIAGNOSIS — M5030 Other cervical disc degeneration, unspecified cervical region: Secondary | ICD-10-CM

## 2021-10-07 DIAGNOSIS — G8929 Other chronic pain: Secondary | ICD-10-CM | POA: Diagnosis not present

## 2021-10-07 DIAGNOSIS — M4716 Other spondylosis with myelopathy, lumbar region: Secondary | ICD-10-CM | POA: Diagnosis not present

## 2021-10-07 DIAGNOSIS — M546 Pain in thoracic spine: Secondary | ICD-10-CM | POA: Diagnosis not present

## 2021-10-07 DIAGNOSIS — M25511 Pain in right shoulder: Secondary | ICD-10-CM | POA: Insufficient documentation

## 2021-10-07 DIAGNOSIS — G894 Chronic pain syndrome: Secondary | ICD-10-CM | POA: Diagnosis not present

## 2021-10-07 DIAGNOSIS — Z79899 Other long term (current) drug therapy: Secondary | ICD-10-CM

## 2021-10-07 DIAGNOSIS — M25512 Pain in left shoulder: Secondary | ICD-10-CM | POA: Insufficient documentation

## 2021-10-07 NOTE — Progress Notes (Unsigned)
Subjective:    Patient ID: Lindsey Richardson, female    DOB: 21-Jan-1935, 87 y.o.   MRN: [id number]  HPI: Lindsey Richardson is a 87 y.o. female who returns for follow up appointment for chronic pain and medication refill.  Spoke with Lindsey Richardson via interpretor she stated her  pain is located in her right shoulder, upper- lower back pain. She also states she has pain in her bilateral eyes, opthalmology following, states her daughter Lindsey Richardson. She  rates her pain 5. Her current exercise regime is walking.  Ms. [place] son called office on 09/24/2021, stating he wanted his mother off the Tramadol, a weaned scheduled was provided.   Lindsey Richardson ( daughter) brought Lindsey Richardson  to today's appointment, discussed her brother's call with her, she stated " she wasn't  aware of her brother issues, and  stated she has been giving her mother ( Lindsey Richardson) the Tramadol.   The DPR was reviewed: placed a call to Mr. Devra Dopp ( son), for clarification of the above. He stated he doesn't want his mother on the Tramadol. Ms. Teagarden moved in with him three months ago and his daughter is caring for Lindsey Richardson. I spoke with Almeta Monas, she states her grandmother hasn't taken the Tramadol while she has been caring for her. Cyndee Brightly also stated " her grandmother ( Lindsey Richardson) is not having any pain").  She doesn't know if her [name] has been giving  the Tramadol to her grandmother, when she is visiting with her.  Spoke with Lindsey Richardson ( daughter) and Cyndee Brightly ( granddaughter) regarding the family dynamic issues. Call was placed to our mangers regarding the above. In detail. No Tramadol prescription was prescribed today, grandddaughter Cyndee Brightly states Ms. Knee doesn't need to F/U with our office. This was relayed to interpretor, patient and daughter.   Daughter, Interpretor stating Ms. Knighton wants Lindsey Richardson involved in her care, since Ms. Rawl has a diagnosis of:  Late onset Mixed Dementia, severe likely due to Alzheimer's  Disease and Vascular , per Sharene Butters [facility] neurology note. The above was discussed in detail with Lindsey Richardson and Cyndee Brightly, they verbalize understanding.   Nada wanted Lindsey Richardson to have a UDS to prove she has been taking the Tramadol, UDS was ordered today, Cyndee Brightly ( granddaughter is aware of the above and verbalizes understanding.   Pain Inventory Average Pain 9 Pain Right Now 5 My pain is sharp and aching  In the last 24 hours, has pain interfered with the following? General activity 1 Relation with others 3 Enjoyment of life 2 What TIME of day is your pain at its worst? daytime Sleep (in general) Fair  Pain is worse with: walking, bending, and some activites Pain improves with: rest and medication Relief from Meds: 6  History reviewed. No pertinent family history. Social History   Socioeconomic History   Marital status: Widowed    Spouse name: Not on file   Number of children: Not on file   Years of education: Not on file   Highest education level: Not on file  Occupational History   Not on file  Tobacco Use   Smoking status: Never   Smokeless tobacco: Never  Vaping Use   Vaping Use: Never used  Substance and Sexual Activity   Alcohol use: No   Drug use: No   Sexual activity: Not on file  Other Topics Concern   Not on file  Social History Narrative   Right handed   Lives with family    Social  Determinants of Health   Financial Resource Strain: Not on file  Food Insecurity: Not on file  Transportation Needs: Not on file  Physical Activity: Not on file  Stress: Not on file  Social Connections: Not on file   Past Surgical History:  Procedure Laterality Date   HERNIA REPAIR     REVERSE SHOULDER ARTHROPLASTY Right 01/01/2017   REVERSE SHOULDER ARTHROPLASTY Right 01/01/2017   Procedure: REVERSE SHOULDER ARTHROPLASTY;  Surgeon: Tania Ade, MD;  Location: [place];  Service: Orthopedics;  Laterality: Right;  Right reverse total shoulder arthroplasty   Past Surgical  History:  Procedure Laterality Date   HERNIA REPAIR     REVERSE SHOULDER ARTHROPLASTY Right 01/01/2017   REVERSE SHOULDER ARTHROPLASTY Right 01/01/2017   Procedure: REVERSE SHOULDER ARTHROPLASTY;  Surgeon: Tania Ade, MD;  Location: [place];  Service: Orthopedics;  Laterality: Right;  Right reverse total shoulder arthroplasty   Past Medical History:  Diagnosis Date   Arthritis    GERD (gastroesophageal reflux disease)    pt denies   Hyperlipidemia    Migraine    Osteopenia    Osteoporosis    Vitamin D deficiency    BP 107/69   Pulse 66   Ht '4\' 10"'$  (1.473 m)   Wt 135 lb (61.2 kg)   SpO2 93%   BMI 28.22 kg/m   Opioid Risk Score:   Fall Risk Score:  `1  Depression screen PHQ 2/9     10/07/2021    1:04 PM 04/09/2021    3:00 PM 10/09/2020    3:31 PM 04/10/2020    3:19 PM 04/27/2018    2:56 PM  Depression screen PHQ 2/9  Decreased Interest '3 1 1 1 1  '$ Down, Depressed, Hopeless '3 1 1 1 1  '$ PHQ - 2 Score '6 2 2 2 2  '$ Altered sleeping     1  Tired, decreased energy     2  Change in appetite     1  Feeling bad or failure about yourself      3  Trouble concentrating     2  Moving slowly or fidgety/restless     2  Suicidal thoughts     0  PHQ-9 Score     13  Difficult doing work/chores     Very difficult     Review of Systems  Constitutional: Negative.   HENT: Negative.    Eyes: Negative.   Respiratory: Negative.    Cardiovascular: Negative.   Gastrointestinal: Negative.   Endocrine: Negative.   Genitourinary: Negative.   Musculoskeletal:  Positive for back pain.  Skin: Negative.   Allergic/Immunologic: Negative.   Neurological: Negative.   Hematological: Negative.   Psychiatric/Behavioral: Negative.        Objective:   Physical Exam Vitals and nursing note reviewed.  Constitutional:      Appearance: Normal appearance.  Cardiovascular:     Rate and Rhythm: Normal rate and regular rhythm.     Pulses: Normal pulses.     Heart sounds: Normal heart sounds.   Pulmonary:     Effort: Pulmonary effort is normal.     Breath sounds: Normal breath sounds.  Musculoskeletal:     Cervical back: Normal range of motion and neck supple.     Comments: Normal Muscle Bulk and Muscle Testing Reveals:  Upper Extremities: Right: Decreased ROM 30 Degrees and Muscle Strength5/5 Left Upper Extremity: Decreased ROM 45 Degrees and Muscle Strength 5/5  Thoracic Paraspinal Tenderness: T-3-T-7 Lumbar Paraspinal Tenderness: L-3-L-5 Lower Extremities:  Full ROM and Muscle Strength 5/5 Arises from Table Slowly Narrow Based  Gait     Skin:    General: Skin is warm and dry.  Neurological:     Mental Status: She is alert and oriented to person, place, and time.  Psychiatric:        Mood and Affect: Mood normal.        Behavior: Behavior normal.         Assessment & Plan:  1. S/P Reverse Total Shoulder arthroplasty: S/P 01/01/2017. Continue HEP as Tolerated. Continue to Monitor. 10/07/2021. 2.  Lumbar Spondylosis: Continue HEP as Tolerated. Continue to Monitor. 10/07/2021  3.Polyarthralgia: Continue to alternate with Heat and Ice Therapy. Continue to monitor. 10/07/2021 4. Chronic Pain Syndrome: Tramadol being weaned off per son request. . We will continue the opioid monitoring program, this consists of regular clinic visits, examinations, urine drug screen, pill counts as well as use of [place] Controlled Substance Reporting system. A 12 month History has been reviewed on the [place] Controlled Substance Reporting System on 10/07/2021  5. Chronic Bilateral Knee Pain: No Complaints Today. Continue HEP as Tolerated. Continue current medication regimen. 10/07/2021

## 2021-10-07 NOTE — Telephone Encounter (Signed)
Spoke with manager Lindsey Lindsey to ask for advise on a patient situation about who to give information to as far as the DPR. DPR signed from [facility] Neurology for all [facility] offices states that only the son, grandson and granddaughter can be talked to about any information. Lindsey Lindsey stated that since there is nothing stating that the daughter Lindsey Lindsey can have any information, the 3 of them need to have documentation stating that she can't have any information. Also, they need to get a healthcare power of attorney for the patient since she has dementia. Spoke with Lindsey Lindsey, the granddaughter, and informed her of the information above. She stated that her brother got paper work filed to become healthcare power of attorney "because her grandmother's dementia is getting worse". She also stated that "she knows her aunt is taking the Tramadol because she told me. I am filing a report once my grandmother leaves to go overseas to see my other aunt". She stated that they do not give the patient any pain medication at all. I informed her that a drug screen was ordered today to see if the patient is taking the medication.  I informed her that we need to get the healthcare power of attorney papers in our system as soon as possible and to also have it documented that no one else can have information on the patient. She verbalized understanding and stated she will get those sent to [place] as soon as she can.

## 2021-10-12 LAB — TOXASSURE SELECT,+ANTIDEPR,UR

## 2021-10-21 ENCOUNTER — Telehealth: Payer: Self-pay | Admitting: *Deleted

## 2021-10-21 NOTE — Telephone Encounter (Signed)
Urine drug screen for this encounter is consistent for prescribed medication 

## 2021-10-23 ENCOUNTER — Ambulatory Visit: Payer: Medicare Other | Admitting: Registered Nurse

## 2021-10-28 ENCOUNTER — Ambulatory Visit: Payer: Medicare Other | Admitting: Registered Nurse

## 2022-02-07 ENCOUNTER — Ambulatory Visit: Payer: Medicare Other | Admitting: Physician Assistant

## 2022-02-07 NOTE — Progress Notes (Incomplete)
Assessment/Plan:   Dementia likely due to  Lindsey Richardson is a very pleasant 87 y.o. RH female with a history of polyarthralgia, rheumatoid arthritis, hyperlipidemia, migraines, remote brain injury, fibromyalgia, and a history of  dementia due mixed Alzheimer's and Vascular disease, late onset.  She is seen today for evaluation of memory loss. Last MMSE was 7/30. MRI brain 10/2020 personally reviewed was remarkable for  Mild-to-moderate chronic small vessel ischemic changes within the cerebral white matter, and moderate generalized cerebral atrophy with comparatively progressed mild cerebellar atrophy. She is no longer on memantine as there were no therapeutic benefits gived advanced disease.      Follow up in   months. Recommend good control of cardiovascular risk factors.   Continue to control mood as per PCP. Continue Seroquel.     Subjective:    This patient is accompanied in the office by her niece *** who supplements the history.  Previous records as well as any outside records available were reviewed prior to todays visit.    Any changes in memory since last visit? "Memory is worse"*** both STM And LTM repeats oneself?  Endorsed Disoriented when walking into a room? Endorsed. Sometimes she does not recognize her house***  Leaving objects in unusual places?  Endorsed*** She moves things around, especially clothes.  Ambulates  with difficulty?  Endorsed, as she is blind. Recent falls?  Patient denies   Any head injuries?  Patient denies   History of seizures?   Patient denies   Wandering behavior?  Patient denies   Patient drives?   Patient no longer drives  Any mood changes since last visit?  *** When she cannot find something she opens and closes doors, especially when anxious . Any worsening depression?:  Patient denies   Hallucinations?  *** She talks to people that are not in the room, especially at night. She frequently talks to her deceased husband.  Paranoia?   Patient denies   Patient reports that sleeps well without vivid dreams, REM behavior or sleepwalking   History of sleep apnea?  Patient denies   Any hygiene concerns? *** She is resistant to showering. Her niece helps her shower at least once a week.    Independent of bathing and dressing?  Endorsed  Does the patient needs help with medications? Niece in charge *** Who is in charge of the finances?  Patient's son is in charge  *** Any changes in appetite?  Patient denies  *** Patient have trouble swallowing? Patient denies   Does the patient cook?  Patient denies   Any kitchen accidents such as leaving the stove on? Patient denies   Any headaches? *** She has a history of migraines, subsides when lying down and tylenol/advil.  Double vision? Patient denies but has a history of ocular cicatricial pemphigoid and dry eyes treated by ophthalmologist at [facility]     Any focal numbness or tingling?  Patient denies   Chronic back pain Patient denies   Unilateral weakness?  Patient denies   Any tremors?  Patient denies   Any history of anosmia?  Patient denies   Any incontinence of urine?  She has a history of mixed incontinence. Any bowel dysfunction?  History of constipation Patient lives with:     Initial Visit 09/18/20 The patient is seen in neurologic consultation at the request of [name], MD for the evaluation of memory.  The patient is accompanied by daughter who supplements the history.Patient wants daughter only as an interpreter and waiver  for declining these services has been signed. The majority of the information is obtained from her daughter due to the patient's dementia.  The patient is a very pleasant 87 y.o. year old Burns speaker [place]  female who has had memory issues for the last 3 years, more noticeable over the last 12 months.  "Starting with forgetting what she was doing when entering the room, or looking for her lunch, when the food was in front of her ".  When asked the  patient how her memory is, she reports "excellent ". The patient has been denying her symptoms "forever".  Sometimes, she begins to cry for no reason.  "She also talks to people that they are not in the room, especially at night".  She frequently talks to her dead husband (he died 93 years ago).  She also has conversations with people who are alive but they are out of state. At times, she reports animals in the room. She goes to sleep at midnight, and she wakes up around 1pm, but that " has been always have a pattern".  She takes occasionally a nap. When sleeping, daughter hears her screaming at times, but denies sleepwalking. The patient lives with her daughter and her daughter's family, and her mood is mostly good, without irritability. Other than the hallucinations mentioned above, there is no apparent paranoia.  The patient dresses up and bathes without help. Daughter is in charge of the medications, finances and driving.  She cooks with assistance, has not been leaving the stove or the faucet on.  Denies leaving objects in unusual places.  She ambulates without difficulty without a walker or a cane.  She used to spend her time visiting and staying with her other 2 children, one in the [place], and one in [place] until moving with her.    She has a history of falls especially in 2019 with head injury.  The patient has a history of migraine headaches for at least a year, with a frequency of 3 times a week, accompanied by photophobia.  She has to lay down, until these subside.  Sometimes she takes Tylenol, and at times she takes Advil.  Per chart note, the patient is being seen at the headache clinic, although no notes are available for review.  She denies any double vision, but she has ocular cicatricial pemphigoid and dry eyes treated by ophthalmologist at [facility].  Denies vertigo, dizziness, focal numbness or tingling, unilateral weakness or tremors. She has chronic urine incontinence and uses diapers. The  patient has a history of constipation as well.  She denies any alcohol or tobacco history. There is no known family history of dementia. She does not have school education. It appears that the patient had already brain atrophy dating back to April 2011 per MRI available for review.   CT head 04/20/17 taken for headaches and memory loss, s/p head injury 5 months prior No acute intracranial abnormality.Chronic microvascular changes. Atrophy.      CTA head and neck 1. No acute intracranial pathology. 2. Moderate chronic white matter microangiopathy. 3. Kink in the proximal right common carotid artery resulting in up to approximately 50% stenosis. Otherwise, minimal calcified atherosclerotic plaque in the bilateral carotid bulbs without hemodynamically significant stenosis or occlusion. The high cervical internal carotid arteries are tortuous bilaterally which can be seen with hypertension. 4. Compression of the left V2 segment at the C4-C5 level by prominent uncovertebral spurs resulting in approximately 50-60% stenosis. Otherwise, patent vertebral arteries. 5. Patent intracranial  vasculature.      MRI brain 10/12/20 No evidence of acute intracranial abnormality.Mild-to-moderate chronic small vessel ischemic changes within the cerebral white matter, progressed as compared to the brain MRI 07/15/2009.Moderate generalized cerebral atrophy with comparatively mild cerebellar atrophy, also progressed.   Remote MRI 07/2009 taken for headaches noted mild atrophy and small vessel disease, atrophy noted   04/02/20 labs:  TSH 0.78 B12 716  08/16/20 CBC normal  PREVIOUS MEDICATIONS: memantine "did not help"  CURRENT MEDICATIONS:  Outpatient Encounter Medications as of 02/07/2022  Medication Sig   cycloSPORINE (RESTASIS) 0.05 % ophthalmic emulsion 1 drop into affected eye   LORazepam (ATIVAN) 1 MG tablet Take 0.5 tablets (0.5 mg total) by mouth every 8 (eight) hours as needed (dizziness).   memantine  (NAMENDA) 10 MG tablet Take 1 tablet (10 mg at night) for 2 weeks, then increase to 1 tablet (10 mg) twice a day   neomycin-polymyxin b-dexamethasone (MAXITROL) 3.5-10000-0.1 OINT at bedtime.   tolterodine (DETROL LA) 4 MG 24 hr capsule Take 4 mg by mouth daily.   traMADol (ULTRAM) 50 MG tablet TAKE 1 TABLET(50 MG) BY MOUTH THREE TIMES DAILY AS NEEDED. Do Not Fill Before 05/01/2021   No facility-administered encounter medications on file as of 02/07/2022.       09/19/2020    7:00 AM  MMSE - Mini Mental State Exam  Orientation to time 2  Orientation to time comments [place], Tuesday  Orientation to Place 1  Orientation to Place-comments Dr. Gabriel Carina  Registration 2  Registration-comments they were asked in Arabic by her daughter  Attention/ Calculation 0  Recall 0  Language- name 2 objects 2  Language- repeat 0  Language- follow 3 step command 0  Language- read & follow direction 0  Language-read & follow direction-comments unable to read  Write a sentence 0  Write a sentence-comments patient cannot write  Copy design 0  Total score 7       No data to display          Objective:     PHYSICAL EXAMINATION:    VITALS:  There were no vitals filed for this visit.  GEN:  The patient appears stated age and is in NAD. HEENT:  Normocephalic, atraumatic.   Neurological examination:  General: NAD, well-groomed, appears stated age. Orientation: The patient is alert. Oriented to person, place and date Cranial nerves: Decreased vision. There is good facial symmetry.The speech is not fluent and clear in her language. No aphasia or dysarthria. Fund of knowledge is reduced. Recent and remote memory are impaired. Attention and concentration are reduced.  Able to name objects and unable to repeat phrases.  Hearing is intact to conversational tone.    Sensation: Sensation is intact to light touch throughout Motor: Strength is at least antigravity x4. Tremors: none  DTR's 2/4 in UE/LE      Movement examination: Tone: There is normal tone in the UE/LE Abnormal movements:  no tremor.  No myoclonus.  No asterixis.   Coordination:  There is no decremation with RAM's. Normal finger to nose  Gait and Station: The patient has some difficulty arising out of a deep-seated chair without the use of the hands. The patient's stride length is good.  Gait is cautious and narrow.    Thank you for allowing [place] the opportunity to participate in the care of this nice patient. Please do not hesitate to contact us for any questions or concerns.   Total time spent on today's visit was ***  minutes dedicated to this patient today, preparing to see patient, examining the patient, ordering tests and/or medications and counseling the patient, documenting clinical information in the EHR or other health record, independently interpreting results and communicating results to the patient/family, discussing treatment and goals, answering patient's questions and coordinating care.  Cc:  [name], MD (Inactive)  Sharene Butters 02/07/2022 7:27 AM

## 2022-04-22 ENCOUNTER — Encounter: Payer: Medicare Other | Attending: Registered Nurse | Admitting: Registered Nurse

## 2022-04-22 NOTE — Progress Notes (Deleted)
Subjective:    Patient ID: Lindsey Richardson, female    DOB: 27-Jan-1935, 88 y.o.   MRN: [id number]  HPI   Pain Inventory Average Pain {NUMBERS; 0-10:5044} Pain Right Now {NUMBERS; 0-10:5044} My pain is {PAIN DESCRIPTION:21022940}  In the last 24 hours, has pain interfered with the following? General activity {NUMBERS; 0-10:5044} Relation with others {NUMBERS; 0-10:5044} Enjoyment of life {NUMBERS; 0-10:5044} What TIME of day is your pain at its worst? {time of day:24191} Sleep (in general) {BHH GOOD/FAIR/POOR:22877}  Pain is worse with: {ACTIVITIES:21022942} Pain improves with: {PAIN IMPROVES BW:[id number] Relief from Meds: {NUMBERS; 0-10:5044}  No family history on file. Social History   Socioeconomic History   Marital status: Widowed    Spouse name: Not on file   Number of children: Not on file   Years of education: Not on file   Highest education level: Not on file  Occupational History   Not on file  Tobacco Use   Smoking status: Never   Smokeless tobacco: Never  Vaping Use   Vaping Use: Never used  Substance and Sexual Activity   Alcohol use: No   Drug use: No   Sexual activity: Not on file  Other Topics Concern   Not on file  Social History Narrative   Right handed   Lives with family    Social Determinants of Health   Financial Resource Strain: Not on file  Food Insecurity: Not on file  Transportation Needs: Not on file  Physical Activity: Not on file  Stress: Not on file  Social Connections: Not on file   Past Surgical History:  Procedure Laterality Date   HERNIA REPAIR     REVERSE SHOULDER ARTHROPLASTY Right 01/01/2017   REVERSE SHOULDER ARTHROPLASTY Right 01/01/2017   Procedure: REVERSE SHOULDER ARTHROPLASTY;  Surgeon: Tania Ade, MD;  Location: [place];  Service: Orthopedics;  Laterality: Right;  Right reverse total shoulder arthroplasty   Past Surgical History:  Procedure Laterality Date   HERNIA REPAIR     REVERSE SHOULDER  ARTHROPLASTY Right 01/01/2017   REVERSE SHOULDER ARTHROPLASTY Right 01/01/2017   Procedure: REVERSE SHOULDER ARTHROPLASTY;  Surgeon: Tania Ade, MD;  Location: [place];  Service: Orthopedics;  Laterality: Right;  Right reverse total shoulder arthroplasty   Past Medical History:  Diagnosis Date   Arthritis    GERD (gastroesophageal reflux disease)    pt denies   Hyperlipidemia    Migraine    Osteopenia    Osteoporosis    Vitamin D deficiency    There were no vitals taken for this visit.  Opioid Risk Score:   Fall Risk Score:  `1  Depression screen PHQ 2/9     10/07/2021    1:04 PM 04/09/2021    3:00 PM 10/09/2020    3:31 PM 04/10/2020    3:19 PM 04/27/2018    2:56 PM  Depression screen PHQ 2/9  Decreased Interest 3 1 1 1 1  $ Down, Depressed, Hopeless 3 1 1 1 1  $ PHQ - 2 Score 6 2 2 2 2  $ Altered sleeping     1  Tired, decreased energy     2  Change in appetite     1  Feeling bad or failure about yourself      3  Trouble concentrating     2  Moving slowly or fidgety/restless     2  Suicidal thoughts     0  PHQ-9 Score     13  Difficult doing work/chores  Very difficult    Review of Systems     Objective:   Physical Exam        Assessment & Plan:

## 2022-04-29 ENCOUNTER — Ambulatory Visit: Payer: Medicare Other | Admitting: Physician Assistant
